# Patient Record
Sex: Female | Born: 1970 | Race: White | Hispanic: No | Marital: Married | Smoking: Former smoker
Health system: Southern US, Community
[De-identification: ages and names within clinical notes are randomized; demographics above are authoritative.]

## PROBLEM LIST (undated history)

## (undated) DIAGNOSIS — R519 Headache, unspecified: Secondary | ICD-10-CM

## (undated) DIAGNOSIS — R011 Cardiac murmur, unspecified: Secondary | ICD-10-CM

## (undated) DIAGNOSIS — O1490 Unspecified pre-eclampsia, unspecified trimester: Secondary | ICD-10-CM

## (undated) DIAGNOSIS — R112 Nausea with vomiting, unspecified: Secondary | ICD-10-CM

## (undated) DIAGNOSIS — I10 Essential (primary) hypertension: Secondary | ICD-10-CM

## (undated) DIAGNOSIS — Z9889 Other specified postprocedural states: Secondary | ICD-10-CM

## (undated) DIAGNOSIS — F329 Major depressive disorder, single episode, unspecified: Secondary | ICD-10-CM

## (undated) DIAGNOSIS — R944 Abnormal results of kidney function studies: Secondary | ICD-10-CM

## (undated) DIAGNOSIS — B019 Varicella without complication: Secondary | ICD-10-CM

## (undated) DIAGNOSIS — K219 Gastro-esophageal reflux disease without esophagitis: Secondary | ICD-10-CM

## (undated) DIAGNOSIS — F32A Depression, unspecified: Secondary | ICD-10-CM

## (undated) DIAGNOSIS — R51 Headache: Secondary | ICD-10-CM

## (undated) HISTORY — DX: Unspecified pre-eclampsia, unspecified trimester: O14.90

## (undated) HISTORY — PX: OTHER SURGICAL HISTORY: SHX169

## (undated) HISTORY — PX: KIDNEY STONE SURGERY: SHX686

## (undated) HISTORY — DX: Headache: R51

## (undated) HISTORY — DX: Depression, unspecified: F32.A

## (undated) HISTORY — DX: Varicella without complication: B01.9

## (undated) HISTORY — DX: Essential (primary) hypertension: I10

## (undated) HISTORY — DX: Headache, unspecified: R51.9

## (undated) HISTORY — DX: Gastro-esophageal reflux disease without esophagitis: K21.9

## (undated) HISTORY — DX: Major depressive disorder, single episode, unspecified: F32.9

## (undated) HISTORY — DX: Cardiac murmur, unspecified: R01.1

---

## 2002-05-14 ENCOUNTER — Other Ambulatory Visit: Admission: RE | Admit: 2002-05-14 | Discharge: 2002-05-14 | Payer: Self-pay | Admitting: Obstetrics and Gynecology

## 2006-10-30 ENCOUNTER — Encounter: Payer: Self-pay | Admitting: Maternal & Fetal Medicine

## 2006-11-20 ENCOUNTER — Ambulatory Visit: Payer: Self-pay | Admitting: Obstetrics and Gynecology

## 2006-11-25 ENCOUNTER — Ambulatory Visit: Payer: Self-pay | Admitting: Obstetrics and Gynecology

## 2006-12-04 ENCOUNTER — Encounter: Payer: Self-pay | Admitting: Maternal & Fetal Medicine

## 2006-12-26 ENCOUNTER — Ambulatory Visit: Payer: Self-pay | Admitting: Obstetrics and Gynecology

## 2006-12-26 HISTORY — PX: UTERINE SEPTUM RESECTION: SHX5386

## 2007-01-08 ENCOUNTER — Encounter: Payer: Self-pay | Admitting: Maternal & Fetal Medicine

## 2008-03-31 ENCOUNTER — Encounter: Payer: Self-pay | Admitting: Maternal & Fetal Medicine

## 2008-04-14 ENCOUNTER — Encounter: Payer: Self-pay | Admitting: Maternal & Fetal Medicine

## 2008-04-24 ENCOUNTER — Encounter: Payer: Self-pay | Admitting: Maternal & Fetal Medicine

## 2008-05-08 ENCOUNTER — Encounter: Payer: Self-pay | Admitting: Obstetrics and Gynecology

## 2010-01-29 ENCOUNTER — Emergency Department: Payer: Self-pay | Admitting: Emergency Medicine

## 2016-01-22 ENCOUNTER — Ambulatory Visit (INDEPENDENT_AMBULATORY_CARE_PROVIDER_SITE_OTHER): Payer: 59 | Admitting: Family Medicine

## 2016-01-22 ENCOUNTER — Encounter: Payer: Self-pay | Admitting: Family Medicine

## 2016-01-22 VITALS — BP 120/82 | HR 89 | Temp 98.5°F | Ht 63.0 in | Wt 119.6 lb

## 2016-01-22 DIAGNOSIS — M533 Sacrococcygeal disorders, not elsewhere classified: Secondary | ICD-10-CM | POA: Diagnosis not present

## 2016-01-22 NOTE — Progress Notes (Addendum)
Patient ID: Misty Fernandez, female   DOB: 12-19-1971, 45 y.o.   MRN: [id number]  Marikay Alar, MD Phone: [phone]  Misty Fernandez is a 45 y.o. female who presents today for new patient visit.  Coccyx pain: Patient notes for the last 1-1.5 months she has had mild discomfort at the tip of her tailbone. Notes it is tender when she sits a certain way. She notes she has been sitting a lot on airplanes recently. The pain started just before that. She notes no direct injury to the area. She notes it is not very painful today. No skin changes. No bruising. She reports normal bowel movements every day. No abnormal vaginal bleeding. No rectal bleeding. No weight loss. No fevers. She has not taken any medicines for this.  Active Ambulatory Problems    Diagnosis Date Noted  . Coccyx pain 01/22/2016   Resolved Ambulatory Problems    Diagnosis Date Noted  . No Resolved Ambulatory Problems   Past Medical History  Diagnosis Date  . Chickenpox   . Headache   . GERD (gastroesophageal reflux disease)   . Heart murmur   . High blood pressure   . Preeclampsia     Family History  Problem Relation Age of Onset  . Arthritis      Parent, Grandparent  . Heart disease      Parent, grandparent  . High blood pressure      Parent  . Stroke      Grandparent  . Diabetes      Parent    Social History   Social History  . Marital Status: Married    Spouse Name: N/A  . Number of Children: N/A  . Years of Education: N/A   Occupational History  . Not on file.   Social History Main Topics  . Smoking status: Former Games developer  . Smokeless tobacco: Not on file  . Alcohol Use: 0.6 oz/week    1 Standard drinks or equivalent per week  . Drug Use: No  . Sexual Activity: Not on file   Other Topics Concern  . Not on file   Social History Narrative  . No narrative on file    ROS   General:  Negative for nexplained weight loss, fever Skin: Negative for new or changing mole, sore that won't  heal HEENT: Negative for trouble hearing, trouble seeing, ringing in ears, mouth sores, hoarseness, change in voice, dysphagia. CV:  Negative for chest pain, dyspnea, edema, palpitations Resp: Negative for cough, dyspnea, hemoptysis GI: Negative for nausea, vomiting, diarrhea, constipation, abdominal pain, melena, hematochezia. GU: Negative for dysuria, incontinence, urinary hesitance, hematuria, vaginal or penile discharge, polyuria, sexual difficulty, lumps in testicle or breasts MSK: Negative for muscle cramps or aches, joint pain or swelling Neuro: Negative for headaches, weakness, numbness, dizziness, passing out/fainting Psych: Negative for depression, anxiety, memory problems  Objective  Physical Exam Filed Vitals:   01/22/16 0829  BP: 120/82  Pulse: 89  Temp: 98.5 F (36.9 C)   Physical Exam  Constitutional: She is well-developed, well-nourished, and in no distress.  HENT:  Head: Normocephalic and atraumatic.  Right Ear: External ear normal.  Left Ear: External ear normal.  Mouth/Throat: Oropharynx is clear and moist. No oropharyngeal exudate.  Eyes: Conjunctivae are normal. Pupils are equal, round, and reactive to light.  Neck: Neck supple.  Cardiovascular: Normal rate, regular rhythm and normal heart sounds.  Exam reveals no gallop and no friction rub.   No murmur heard. Pulmonary/Chest: Effort normal  and breath sounds normal. No respiratory distress. She has no wheezes. She has no rales.  Musculoskeletal:  No midline spine tenderness, no midline spine step-off, no muscular back tenderness, examination of the sacrum and coccyx reveals no skin abnormalities or swelling, there is no erythema of this area, there is minimal tenderness of the tip of the coccyx, there is no apparent skin changes inferior to this  Lymphadenopathy:    She has no cervical adenopathy.  Neurological: She is alert.  5 strength in bilateral quads, hamstrings, plantar flexion, and dorsiflexion,  sensation to light touch intact in bilateral lower extremities.  Skin: Skin is warm and dry. She is not diaphoretic.  Psychiatric: Mood and affect normal.     Assessment/Plan:   Coccyx pain Patient with 1-1.5 months of coccydynia with no apparent cause. No trauma to the area. No signs of infection in the area.  no constipation. No evidence of malignant process on review of systems. Suspect minor repetitive trauma as cause. Advised on getting a doughnut or wedge pillow from the pharmacy to sit on to help take pressure off this. Can use ibuprofen 600 mg every 8 hours as advised. Ice and heat. Continue to monitor. Given return precautions precautions.    Marikay Alar

## 2016-01-22 NOTE — Progress Notes (Signed)
Pre visit review using our clinic review tool, if applicable. No additional management support is needed unless otherwise documented below in the visit note. 

## 2016-01-22 NOTE — Patient Instructions (Signed)
Nice to meet you. You have likely irritated your coccyx. The repeated sitting on airplane seats has likely continued irritated it. You should buy a doughnut pillow or a wedge pillow to take the pressure off the coccyx. You can use ibuprofen 600 mg every 8 hours as needed for discomfort. He can also use heat or ice on the area. If you develop fever, weight loss, constipation, numbness, weakness, loss of bowel or bladder function, numbness between her legs, or any new or change in symptoms please seek medical attention.

## 2016-01-22 NOTE — Assessment & Plan Note (Addendum)
Patient with 1-1.5 months of coccydynia with no apparent cause. No trauma to the area. No signs of infection in the area.  no constipation. No evidence of malignant process on review of systems. Suspect minor repetitive trauma as cause. Advised on getting a doughnut or wedge pillow from the pharmacy to sit on to help take pressure off this. Can use ibuprofen 600 mg every 8 hours as advised. Ice and heat. Continue to monitor. Given return precautions precautions.

## 2016-03-01 ENCOUNTER — Encounter: Payer: Self-pay | Admitting: Family Medicine

## 2016-03-01 ENCOUNTER — Other Ambulatory Visit (HOSPITAL_COMMUNITY)
Admission: RE | Admit: 2016-03-01 | Discharge: 2016-03-01 | Disposition: A | Discharge status: Home / Self Care | Payer: 59 | Source: Ambulatory Visit | Attending: Family Medicine | Admitting: Family Medicine

## 2016-03-01 ENCOUNTER — Ambulatory Visit (INDEPENDENT_AMBULATORY_CARE_PROVIDER_SITE_OTHER): Payer: 59 | Admitting: Family Medicine

## 2016-03-01 ENCOUNTER — Other Ambulatory Visit: Payer: Self-pay | Admitting: *Deleted

## 2016-03-01 VITALS — BP 110/68 | HR 70 | Temp 98.2°F | Ht 63.0 in | Wt 119.8 lb

## 2016-03-01 DIAGNOSIS — Z01419 Encounter for gynecological examination (general) (routine) without abnormal findings: Secondary | ICD-10-CM | POA: Insufficient documentation

## 2016-03-01 DIAGNOSIS — N939 Abnormal uterine and vaginal bleeding, unspecified: Secondary | ICD-10-CM | POA: Insufficient documentation

## 2016-03-01 DIAGNOSIS — Z0001 Encounter for general adult medical examination with abnormal findings: Secondary | ICD-10-CM | POA: Insufficient documentation

## 2016-03-01 DIAGNOSIS — Z1151 Encounter for screening for human papillomavirus (HPV): Secondary | ICD-10-CM | POA: Diagnosis present

## 2016-03-01 DIAGNOSIS — R6889 Other general symptoms and signs: Secondary | ICD-10-CM

## 2016-03-01 DIAGNOSIS — Z124 Encounter for screening for malignant neoplasm of cervix: Secondary | ICD-10-CM

## 2016-03-01 DIAGNOSIS — Z0000 Encounter for general adult medical examination without abnormal findings: Secondary | ICD-10-CM | POA: Insufficient documentation

## 2016-03-01 LAB — COMPREHENSIVE METABOLIC PANEL
ALT: 17 U/L (ref 0–35)
AST: 24 U/L (ref 0–37)
Albumin: 4.8 g/dL (ref 3.5–5.2)
Alkaline Phosphatase: 57 U/L (ref 39–117)
BUN: 19 mg/dL (ref 6–23)
CO2: 29 mEq/L (ref 19–32)
Calcium: 10.2 mg/dL (ref 8.4–10.5)
Chloride: 99 mEq/L (ref 96–112)
Creatinine, Ser: 0.94 mg/dL (ref 0.40–1.20)
GFR: 68.22 mL/min (ref >60.00)
Glucose, Bld: 97 mg/dL (ref 70–99)
Potassium: 3.4 mEq/L — ABNORMAL LOW (ref 3.5–5.1)
Sodium: 145 mEq/L (ref 135–145)
Total Bilirubin: 0.5 mg/dL (ref 0.2–1.2)
Total Protein: 7.5 g/dL (ref 6.0–8.3)

## 2016-03-01 LAB — LIPID PANEL
Cholesterol: 212 mg/dL — ABNORMAL HIGH (ref 0–200)
HDL: 68 mg/dL (ref >39.00)
LDL Cholesterol: 112 mg/dL — ABNORMAL HIGH (ref 0–99)
NonHDL: 144.06
Total CHOL/HDL Ratio: 3
Triglycerides: 162 mg/dL — ABNORMAL HIGH (ref 0.0–149.0)
VLDL: 32.4 mg/dL (ref 0.0–40.0)

## 2016-03-01 LAB — POCT URINE PREGNANCY: Preg Test, Ur: NEGATIVE

## 2016-03-01 LAB — CBC
HCT: 46.7 % — ABNORMAL HIGH (ref 36.0–46.0)
Hemoglobin: 15.9 g/dL — ABNORMAL HIGH (ref 12.0–15.0)
MCHC: 34 g/dL (ref 30.0–36.0)
MCV: 92.7 fl (ref 78.0–100.0)
Platelets: 256 10*3/uL (ref 150.0–400.0)
RBC: 5.04 Mil/uL (ref 3.87–5.11)
RDW: 12.9 % (ref 11.5–15.5)
WBC: 5.4 10*3/uL (ref 4.0–10.5)

## 2016-03-01 LAB — TSH: TSH: 0.59 u[IU]/mL (ref 0.35–4.50)

## 2016-03-01 NOTE — Assessment & Plan Note (Addendum)
Bleeding intermittently prolonged up to 8 days. Vital signs are stable. We'll check CBC. Urine pregnancy test was negative. We'll check TSH as well. Unlikely related to prolactin issue given lack of headaches and vision changes. We'll refer to gynecology for consideration of endometrial biopsy given her age and length of bleeding. Given return precautions.

## 2016-03-01 NOTE — Assessment & Plan Note (Signed)
Overall doing well. Pap smear performed today. Discussed diet and exercise and continuing her current diet and exercise. Up-to-date on vaccinations. Has had an HIV test previously. Will obtain screening lab work as below.

## 2016-03-01 NOTE — Progress Notes (Signed)
Pre visit review using our clinic review tool, if applicable. No additional management support is needed unless otherwise documented below in the visit note. 

## 2016-03-01 NOTE — Progress Notes (Signed)
Patient ID: Misty Fernandez, female   DOB: [birth year], 45 y.o.   MRN: [id number]  [name], MD Phone: [phone]  Misty Fernandez is a 45 y.o. female who presents today for a complete physical exam.  Patient reports overall she is doing well. Reports her diet is average. She is a vegetarian. Gets her protein from tofu, beans, and nuts. Drinks diet sodas. She exercises by doing yoga, Pilates, [place], and running 3-4 days per week. Tdap 7 years ago. Flu shot in November. Unsure of her last Pap smear. Notes her periods have become irregular recently. They can last anywhere from 2-8 days. They occur on a regular frequency every month. Notes they have been irregular for the last year. No intermenstrual bleeding. First menstrual period was at 45 years of age. She is a G3 P1 111. She does note that she becomes more easily stressed out and irritated since this has started to occur. Had HIV screening done 12-15 years ago.  Does not smoke. Occasional alcohol use. No illicit drug use.  Active Ambulatory Problems    Diagnosis Date Noted  . Coccyx pain 01/22/2016  . Encounter for general adult medical examination with abnormal findings 03/01/2016  . Abnormal uterine bleeding 03/01/2016   Resolved Ambulatory Problems    Diagnosis Date Noted  . No Resolved Ambulatory Problems   Past Medical History  Diagnosis Date  . Chickenpox   . Headache   . GERD (gastroesophageal reflux disease)   . Heart murmur   . High blood pressure   . Preeclampsia     Family History  Problem Relation Age of Onset  . Arthritis      Parent, Grandparent  . Heart disease      Parent, grandparent  . High blood pressure      Parent  . Stroke      Grandparent  . Diabetes      Parent    Social History   Social History  . Marital Status: Married    Spouse Name: N/A  . Number of Children: N/A  . Years of Education: N/A   Occupational History  . Not on file.   Social History Main Topics  .  Smoking status: Former Research scientist (life sciences)  . Smokeless tobacco: Not on file  . Alcohol Use: 0.6 oz/week    1 Standard drinks or equivalent per week  . Drug Use: No  . Sexual Activity: Not on file   Other Topics Concern  . Not on file   Social History Narrative    ROS   General:  Negative for nexplained weight loss, fever Skin: Negative for new or changing mole, sore that won't heal HEENT: Negative for trouble hearing, trouble seeing, ringing in ears, mouth sores, hoarseness, change in voice, dysphagia. CV:  Negative for chest pain, dyspnea, edema, palpitations Resp: Negative for cough, dyspnea, hemoptysis GI: Negative for nausea, vomiting, diarrhea, constipation, abdominal pain, melena, hematochezia. GU: Negative for dysuria, incontinence, urinary hesitance, hematuria, vaginal or penile discharge, polyuria, sexual difficulty, lumps in testicle or breasts MSK: Negative for muscle cramps or aches, joint pain or swelling Neuro: Negative for headaches, weakness, numbness, dizziness, passing out/fainting Psych: Positive for stress. Negative for depression, anxiety, memory problems  Objective  Physical Exam Filed Vitals:   03/01/16 0824  BP: 110/68  Pulse: 70  Temp: 98.2 F (36.8 C)    BP Readings from Last 3 Encounters:  03/01/16 110/68  01/22/16 120/82   Wt Readings from Last 3 Encounters:  03/01/16 119 lb  12.8 oz (54.341 kg)  01/22/16 119 lb 9.6 oz (54.25 kg)    Physical Exam  Constitutional: She is well-developed, well-nourished, and in no distress.  HENT:  Head: Normocephalic and atraumatic.  Right Ear: External ear normal.  Left Ear: External ear normal.  Mouth/Throat: Oropharynx is clear and moist. No oropharyngeal exudate.  Eyes: Conjunctivae are normal. Pupils are equal, round, and reactive to light.  Neck: Neck supple.  Cardiovascular: Normal rate, regular rhythm and normal heart sounds.  Exam reveals no gallop and no friction rub.   No murmur heard. Pulmonary/Chest:  Effort normal and breath sounds normal. No respiratory distress. She has no wheezes. She has no rales.  Abdominal: Soft. Bowel sounds are normal. She exhibits no distension. There is no tenderness. There is no rebound and no guarding.  Genitourinary:  Normal labia, normal vaginal mucosa with minimal bleeding from the cervical os, no abnormalities to the cervix, no abnormalities on bimanual exam  Musculoskeletal: She exhibits no edema.  Lymphadenopathy:    She has no cervical adenopathy.  Neurological: She is alert. Gait normal.  Skin: Skin is warm and dry. She is not diaphoretic.     Assessment/Plan:   Abnormal uterine bleeding Bleeding intermittently prolonged up to 8 days. Vital signs are stable. We'll check CBC. Urine pregnancy test was negative. We'll check TSH as well. Unlikely related to prolactin issue given lack of headaches and vision changes. We'll refer to gynecology for consideration of endometrial biopsy given her age and length of bleeding. Given return precautions.  Encounter for general adult medical examination with abnormal findings Overall doing well. Pap smear performed today. Discussed diet and exercise and continuing her current diet and exercise. Up-to-date on vaccinations. Has had an HIV test previously. Will obtain screening lab work as below.    Orders Placed This Encounter  Procedures  . TSH  . CBC  . Comp Met (CMET)  . Lipid Profile  . Ambulatory referral to Gynecology    Referral Priority:  Routine    Referral Type:  Consultation    Referral Reason:  Specialty Services Required    Requested Specialty:  Gynecology    Number of Visits Requested:  1  . POCT urine pregnancy    No orders of the defined types were placed in this encounter.     [name], MD [place]

## 2016-03-01 NOTE — Patient Instructions (Signed)
Nice to see you. We are going to refer you to a gynecologist for your prolonged periods.  We will obtain lab work as well.  If you develop persistent vaginal bleeding, increased vaginal bleeding, abdominal pain, or any new or change in symptoms please seek medical attention.

## 2016-03-02 LAB — CYTOLOGY - PAP

## 2016-03-04 ENCOUNTER — Other Ambulatory Visit: Payer: Self-pay | Admitting: Family Medicine

## 2016-03-04 DIAGNOSIS — D582 Other hemoglobinopathies: Secondary | ICD-10-CM

## 2016-03-04 DIAGNOSIS — E876 Hypokalemia: Secondary | ICD-10-CM

## 2016-03-11 ENCOUNTER — Other Ambulatory Visit (INDEPENDENT_AMBULATORY_CARE_PROVIDER_SITE_OTHER): Payer: 59

## 2016-03-11 DIAGNOSIS — D582 Other hemoglobinopathies: Secondary | ICD-10-CM

## 2016-03-11 DIAGNOSIS — E876 Hypokalemia: Secondary | ICD-10-CM

## 2016-03-11 LAB — BASIC METABOLIC PANEL
BUN: 21 mg/dL (ref 6–23)
CO2: 32 mEq/L (ref 19–32)
Calcium: 9.9 mg/dL (ref 8.4–10.5)
Chloride: 102 mEq/L (ref 96–112)
Creatinine, Ser: 0.95 mg/dL (ref 0.40–1.20)
GFR: 67.38 mL/min (ref >60.00)
Glucose, Bld: 93 mg/dL (ref 70–99)
Potassium: 3.9 mEq/L (ref 3.5–5.1)
Sodium: 141 mEq/L (ref 135–145)

## 2016-03-11 LAB — CBC
HCT: 45.1 % (ref 36.0–46.0)
Hemoglobin: 15.7 g/dL — ABNORMAL HIGH (ref 12.0–15.0)
MCHC: 34.9 g/dL (ref 30.0–36.0)
MCV: 90.2 fl (ref 78.0–100.0)
Platelets: 229 10*3/uL (ref 150.0–400.0)
RBC: 4.99 Mil/uL (ref 3.87–5.11)
RDW: 12.5 % (ref 11.5–15.5)
WBC: 6.4 10*3/uL (ref 4.0–10.5)

## 2016-03-14 ENCOUNTER — Telehealth: Payer: Self-pay | Admitting: Family Medicine

## 2016-03-14 ENCOUNTER — Other Ambulatory Visit: Payer: Self-pay | Admitting: Family Medicine

## 2016-03-14 DIAGNOSIS — D582 Other hemoglobinopathies: Secondary | ICD-10-CM

## 2016-03-14 NOTE — Telephone Encounter (Signed)
Notified patient of lab results 

## 2016-03-14 NOTE — Telephone Encounter (Signed)
Pt called to get lab results. Call pt @ [phone]. Thank you!

## 2016-04-14 ENCOUNTER — Other Ambulatory Visit: Payer: 59

## 2017-04-28 ENCOUNTER — Encounter: Payer: Self-pay | Admitting: Family Medicine

## 2017-04-28 ENCOUNTER — Ambulatory Visit (INDEPENDENT_AMBULATORY_CARE_PROVIDER_SITE_OTHER): Payer: 59 | Admitting: Family Medicine

## 2017-04-28 VITALS — BP 130/88 | HR 86 | Temp 98.2°F | Ht 63.0 in | Wt 119.8 lb

## 2017-04-28 DIAGNOSIS — Z1329 Encounter for screening for other suspected endocrine disorder: Secondary | ICD-10-CM | POA: Diagnosis not present

## 2017-04-28 DIAGNOSIS — Z1231 Encounter for screening mammogram for malignant neoplasm of breast: Secondary | ICD-10-CM

## 2017-04-28 DIAGNOSIS — Z130 Encounter for screening for diseases of the blood and blood-forming organs and certain disorders involving the immune mechanism: Secondary | ICD-10-CM

## 2017-04-28 DIAGNOSIS — Z0001 Encounter for general adult medical examination with abnormal findings: Secondary | ICD-10-CM

## 2017-04-28 DIAGNOSIS — F39 Unspecified mood [affective] disorder: Secondary | ICD-10-CM | POA: Insufficient documentation

## 2017-04-28 DIAGNOSIS — Z13220 Encounter for screening for lipoid disorders: Secondary | ICD-10-CM

## 2017-04-28 DIAGNOSIS — F321 Major depressive disorder, single episode, moderate: Secondary | ICD-10-CM

## 2017-04-28 DIAGNOSIS — Z1239 Encounter for other screening for malignant neoplasm of breast: Secondary | ICD-10-CM

## 2017-04-28 MED ORDER — BUPROPION HCL ER (XL) 150 MG PO TB24: 150.0000 mg | ORAL_TABLET | Freq: Every day | ORAL | 1 refills | Status: DC

## 2017-04-28 NOTE — Progress Notes (Signed)
Pre visit review using our clinic review tool, if applicable. No additional management support is needed unless otherwise documented below in the visit note. 

## 2017-04-28 NOTE — Assessment & Plan Note (Signed)
Issues with depression for a long time. No SI. Discussed treatment options. Patient opted for Wellbutrin and referral to psychology. She'll follow-up in 8 weeks. Given return precautions.

## 2017-04-28 NOTE — Assessment & Plan Note (Signed)
Physical exam completed. Breast exam done. Pap smear deferred given negative Pap smear last year. Discussed menstrual cycles. Offered gynecology referral given history of irregular periods though patient deferred. She'll let us know if she continues to have any issues. Mammogram ordered. Plan on rechecking blood pressure at follow-up in 8 weeks. She'll return for fasting lab work.

## 2017-04-28 NOTE — Patient Instructions (Addendum)
Nice to see you. Please continue to work on diet and exercise. Please watch your periods and if they become prolonged or you miss periods please let us know. We'll get a mammogram done. We'll start you on Wellbutrin and have you see a psychologist. If you develop thoughts of harming herself please seek medical attention medially.

## 2017-04-28 NOTE — Assessment & Plan Note (Signed)
>>  ASSESSMENT AND PLAN FOR DEPRESSION, MAJOR, SINGLE EPISODE, MODERATE (HCC) WRITTEN ON 04/28/2017  9:04 AM BY SONNENBERG, ERIC G, MD  Issues with depression for a long time. No SI. Discussed treatment options. Patient opted for Wellbutrin  and referral to psychology. She'll follow-up in 8 weeks. Given return precautions.

## 2017-04-28 NOTE — Progress Notes (Signed)
Misty Rumps, MD Phone: [phone]  Misty Fernandez is a 46 y.o. female who presents today for physical exam.   Exercises 3 times a week by going to exercise class. Diet: She is a vegetarian. Lots of vegetables. Also protein bars. Lots of bagels. Trying to do better with this. Tetanus vaccination up-to-date. Pap smear negative last year. Has menstrual cycles monthly typically. Lasts anywhere from 2-5 days. She had irregular bleeding last year and was referred to gynecology though she did not see them. She wants to proceed with a mammogram. Former smoker. Occasional alcohol use. No illicit drug use. Sees ophthalmologist once yearly.  Depression: This has been an ongoing issue for multiple years. Has seen therapists on several occasions. She has decreased interest in things. Does at times wish she could cease to exist though has no suicidal thoughts or intent or plan to harm herself. Lots of stress at work. Feels this overwhelming weight.  Active Ambulatory Problems    Diagnosis Date Noted  . Coccyx pain 01/22/2016  . Encounter for general adult medical examination with abnormal findings 03/01/2016  . Abnormal uterine bleeding 03/01/2016  . Depression, major, single episode, moderate ([place]) 04/28/2017   Resolved Ambulatory Problems    Diagnosis Date Noted  . No Resolved Ambulatory Problems   Past Medical History:  Diagnosis Date  . Chickenpox   . GERD (gastroesophageal reflux disease)   . Headache   . Heart murmur   . High blood pressure   . Preeclampsia     Family History  Problem Relation Age of Onset  . Arthritis      Parent, Grandparent  . Heart disease      Parent, grandparent  . High blood pressure      Parent  . Stroke      Grandparent  . Diabetes      Parent    Social History   Social History  . Marital status: Married    Spouse name: N/A  . Number of children: N/A  . Years of education: N/A   Occupational History  . Not on file.   Social  History Main Topics  . Smoking status: Former Research scientist (life sciences)  . Smokeless tobacco: Never Used  . Alcohol use 0.6 oz/week    1 Standard drinks or equivalent per week  . Drug use: No  . Sexual activity: Not on file   Other Topics Concern  . Not on file   Social History Narrative  . No narrative on file    ROS  General:  Negative for nexplained weight loss, fever Skin: Negative for new or changing mole, sore that won't heal HEENT: Negative for trouble hearing, trouble seeing, ringing in ears, mouth sores, hoarseness, change in voice, dysphagia. CV:  Negative for chest pain, dyspnea, edema, palpitations Resp: Negative for cough, dyspnea, hemoptysis GI: Negative for nausea, vomiting, diarrhea, constipation, abdominal pain, melena, hematochezia. GU: Negative for dysuria, incontinence, urinary hesitance, hematuria, vaginal or penile discharge, polyuria, sexual difficulty, lumps in testicle or breasts MSK: Negative for muscle cramps or aches, joint pain or swelling Neuro: Negative for headaches, weakness, numbness, dizziness, passing out/fainting Psych: Positive for depression, negative for anxiety, memory problems  Objective  Physical Exam Vitals:   04/28/17 0805 04/28/17 0829  BP: 140/82 130/88  Pulse: 86   Temp: 98.2 F (36.8 C)     BP Readings from Last 3 Encounters:  04/28/17 130/88  03/01/16 110/68  01/22/16 120/82   Wt Readings from Last 3 Encounters:  04/28/17 119 lb  12.8 oz (54.3 kg)  03/01/16 119 lb 12.8 oz (54.3 kg)  01/22/16 119 lb 9.6 oz (54.3 kg)    Physical Exam  Constitutional: She is well-developed, well-nourished, and in no distress.  HENT:  Head: Normocephalic and atraumatic.  Mouth/Throat: Oropharynx is clear and moist. No oropharyngeal exudate.  Eyes: Conjunctivae are normal. Pupils are equal, round, and reactive to light.  Cardiovascular: Normal rate, regular rhythm and normal heart sounds.   Pulmonary/Chest: Effort normal and breath sounds normal.    Abdominal: Soft. Bowel sounds are normal. She exhibits no distension. There is no tenderness. There is no rebound and no guarding.  Genitourinary:  Genitourinary Comments: Bilateral breasts with no masses, skin changes, or nipple changes, no axillary masses bilaterally  Musculoskeletal: She exhibits no edema.  Neurological: She is alert. Gait normal.  Skin: Skin is warm and dry.  Psychiatric:  Mood depressed, affect normal     Assessment/Plan:   Encounter for general adult medical examination with abnormal findings Physical exam completed. Breast exam done. Pap smear deferred given negative Pap smear last year. Discussed menstrual cycles. Offered gynecology referral given history of irregular periods though patient deferred. She'll let us know if she continues to have any issues. Mammogram ordered. Plan on rechecking blood pressure at follow-up in 8 weeks. She'll return for fasting lab work.  Depression, major, single episode, moderate (HCC) Issues with depression for a long time. No SI. Discussed treatment options. Patient opted for Wellbutrin and referral to psychology. She'll follow-up in 8 weeks. Given return precautions.   Orders Placed This Encounter  Procedures  . MM Digital Screening    Standing Status:   Future    Standing Expiration Date:   06/28/2018    Order Specific Question:   Reason for Exam (SYMPTOM  OR DIAGNOSIS REQUIRED)    Answer:   breast cancer screening    Order Specific Question:   Is the patient pregnant?    Answer:   No    Order Specific Question:   Preferred imaging location?    Answer:   [facility]  . CBC    Standing Status:   Future    Standing Expiration Date:   04/28/2018  . Comp Met (CMET)    Standing Status:   Future    Standing Expiration Date:   04/28/2018  . TSH    Standing Status:   Future    Standing Expiration Date:   04/28/2018  . Lipid panel    Standing Status:   Future    Standing Expiration Date:   04/28/2018  . Ambulatory referral  to Psychology    Referral Priority:   Routine    Referral Type:   Psychiatric    Referral Reason:   Specialty Services Required    Requested Specialty:   Psychology    Number of Visits Requested:   1    Meds ordered this encounter  Medications  . buPROPion (WELLBUTRIN XL) 150 MG 24 hr tablet    Sig: Take 1 tablet (150 mg total) by mouth daily.    Dispense:  90 tablet    Refill:  1     Misty Rumps, MD [place]

## 2017-05-19 ENCOUNTER — Telehealth: Payer: Self-pay

## 2017-05-19 ENCOUNTER — Other Ambulatory Visit (INDEPENDENT_AMBULATORY_CARE_PROVIDER_SITE_OTHER): Payer: 59

## 2017-05-19 DIAGNOSIS — Z130 Encounter for screening for diseases of the blood and blood-forming organs and certain disorders involving the immune mechanism: Secondary | ICD-10-CM | POA: Diagnosis not present

## 2017-05-19 DIAGNOSIS — Z1329 Encounter for screening for other suspected endocrine disorder: Secondary | ICD-10-CM

## 2017-05-19 DIAGNOSIS — Z13220 Encounter for screening for lipoid disorders: Secondary | ICD-10-CM | POA: Diagnosis not present

## 2017-05-19 DIAGNOSIS — D582 Other hemoglobinopathies: Secondary | ICD-10-CM

## 2017-05-19 LAB — COMPREHENSIVE METABOLIC PANEL
ALT: 12 U/L (ref 0–35)
AST: 22 U/L (ref 0–37)
Albumin: 5.1 g/dL (ref 3.5–5.2)
Alkaline Phosphatase: 59 U/L (ref 39–117)
BUN: 22 mg/dL (ref 6–23)
CO2: 35 mEq/L — ABNORMAL HIGH (ref 19–32)
Calcium: 10.5 mg/dL (ref 8.4–10.5)
Chloride: 97 mEq/L (ref 96–112)
Creatinine, Ser: 1.07 mg/dL (ref 0.40–1.20)
GFR: 58.44 mL/min — ABNORMAL LOW (ref >60.00)
Glucose, Bld: 109 mg/dL — ABNORMAL HIGH (ref 70–99)
Potassium: 3.6 mEq/L (ref 3.5–5.1)
Sodium: 140 mEq/L (ref 135–145)
Total Bilirubin: 0.6 mg/dL (ref 0.2–1.2)
Total Protein: 7.8 g/dL (ref 6.0–8.3)

## 2017-05-19 LAB — LIPID PANEL
Cholesterol: 208 mg/dL — ABNORMAL HIGH (ref 0–200)
HDL: 66.4 mg/dL (ref >39.00)
LDL Cholesterol: 120 mg/dL — ABNORMAL HIGH (ref 0–99)
NonHDL: 141.84
Total CHOL/HDL Ratio: 3
Triglycerides: 107 mg/dL (ref 0.0–149.0)
VLDL: 21.4 mg/dL (ref 0.0–40.0)

## 2017-05-19 LAB — CBC
HCT: 49.8 % — ABNORMAL HIGH (ref 36.0–46.0)
Hemoglobin: 17 g/dL — ABNORMAL HIGH (ref 12.0–15.0)
MCHC: 34.1 g/dL (ref 30.0–36.0)
MCV: 92.7 fl (ref 78.0–100.0)
Platelets: 277 10*3/uL (ref 150.0–400.0)
RBC: 5.38 Mil/uL — ABNORMAL HIGH (ref 3.87–5.11)
RDW: 13 % (ref 11.5–15.5)
WBC: 4.3 10*3/uL (ref 4.0–10.5)

## 2017-05-19 LAB — TSH: TSH: 0.94 u[IU]/mL (ref 0.35–4.50)

## 2017-05-19 NOTE — Telephone Encounter (Signed)
-----   Message from Glori LuisEric G Sonnenberg, MD sent at 05/19/2017  1:13 PM EDT ----- Please let the patient know that her hemoglobin is elevated slightly higher than it was previously. Given persistent elevation she would benefit from seeing hematology. I can place a referral if she is willing. Her cholesterol is mildly elevated and she should continue to work on diet and exercise. Her kidney function is slightly worse than previously and I would like to recheck this in several weeks. Thanks.

## 2017-05-19 NOTE — Telephone Encounter (Signed)
Left message to return call 

## 2017-05-23 NOTE — Telephone Encounter (Signed)
Left message to return call 

## 2017-05-29 NOTE — Telephone Encounter (Signed)
Patient notified and would like referral to hematology. Patient will have recheck kidney labs done at her follow up appmt in june

## 2017-05-29 NOTE — Addendum Note (Signed)
Addended by: Glori LuisSONNENBERG, Litsy Epting G on: 05/29/2017 08:18 PM   Modules accepted: Orders

## 2017-05-29 NOTE — Telephone Encounter (Signed)
Referral placed.

## 2017-05-29 NOTE — Telephone Encounter (Signed)
Pt called back to get results. Please advise, thank you!  Call pt @ [phone]

## 2017-06-20 ENCOUNTER — Ambulatory Visit (INDEPENDENT_AMBULATORY_CARE_PROVIDER_SITE_OTHER): Payer: 59 | Admitting: Family Medicine

## 2017-06-20 ENCOUNTER — Encounter: Payer: Self-pay | Admitting: Family Medicine

## 2017-06-20 ENCOUNTER — Encounter: Payer: Self-pay | Admitting: Oncology

## 2017-06-20 ENCOUNTER — Inpatient Hospital Stay: Payer: 59 | Attending: Oncology | Admitting: Oncology

## 2017-06-20 ENCOUNTER — Inpatient Hospital Stay: Payer: 59

## 2017-06-20 VITALS — BP 136/84 | HR 69 | Temp 98.1°F | Resp 18 | Ht 63.0 in | Wt 114.3 lb

## 2017-06-20 DIAGNOSIS — R011 Cardiac murmur, unspecified: Secondary | ICD-10-CM | POA: Insufficient documentation

## 2017-06-20 DIAGNOSIS — Z87891 Personal history of nicotine dependence: Secondary | ICD-10-CM | POA: Insufficient documentation

## 2017-06-20 DIAGNOSIS — N939 Abnormal uterine and vaginal bleeding, unspecified: Secondary | ICD-10-CM | POA: Diagnosis not present

## 2017-06-20 DIAGNOSIS — I10 Essential (primary) hypertension: Secondary | ICD-10-CM | POA: Insufficient documentation

## 2017-06-20 DIAGNOSIS — K219 Gastro-esophageal reflux disease without esophagitis: Secondary | ICD-10-CM | POA: Insufficient documentation

## 2017-06-20 DIAGNOSIS — R7989 Other specified abnormal findings of blood chemistry: Secondary | ICD-10-CM | POA: Diagnosis not present

## 2017-06-20 DIAGNOSIS — F321 Major depressive disorder, single episode, moderate: Secondary | ICD-10-CM | POA: Diagnosis not present

## 2017-06-20 DIAGNOSIS — F329 Major depressive disorder, single episode, unspecified: Secondary | ICD-10-CM | POA: Diagnosis not present

## 2017-06-20 DIAGNOSIS — D751 Secondary polycythemia: Secondary | ICD-10-CM

## 2017-06-20 DIAGNOSIS — D582 Other hemoglobinopathies: Secondary | ICD-10-CM | POA: Insufficient documentation

## 2017-06-20 LAB — CBC WITH DIFFERENTIAL/PLATELET
Basophils Absolute: 0 10*3/uL (ref 0–0.1)
Basophils Relative: 1 %
Eosinophils Absolute: 0.1 10*3/uL (ref 0–0.7)
Eosinophils Relative: 2 %
HCT: 45.6 % (ref 35.0–47.0)
Hemoglobin: 16.3 g/dL — ABNORMAL HIGH (ref 12.0–16.0)
Lymphocytes Relative: 27 %
Lymphs Abs: 0.9 10*3/uL — ABNORMAL LOW (ref 1.0–3.6)
MCH: 32.2 pg (ref 26.0–34.0)
MCHC: 35.7 g/dL (ref 32.0–36.0)
MCV: 90.4 fL (ref 80.0–100.0)
Monocytes Absolute: 0.3 10*3/uL (ref 0.2–0.9)
Monocytes Relative: 10 %
Neutro Abs: 2 10*3/uL (ref 1.4–6.5)
Neutrophils Relative %: 60 %
Platelets: 244 10*3/uL (ref 150–440)
RBC: 5.05 MIL/uL (ref 3.80–5.20)
RDW: 12.9 % (ref 11.5–14.5)
WBC: 3.4 10*3/uL — ABNORMAL LOW (ref 3.6–11.0)

## 2017-06-20 LAB — URINALYSIS, COMPLETE (UACMP) WITH MICROSCOPIC
Bacteria, UA: NONE SEEN
Bilirubin Urine: NEGATIVE
Glucose, UA: NEGATIVE mg/dL
Hgb urine dipstick: NEGATIVE
Ketones, ur: NEGATIVE mg/dL
Leukocytes, UA: NEGATIVE
Nitrite: NEGATIVE
Protein, ur: NEGATIVE mg/dL
RBC / HPF: NONE SEEN RBC/hpf (ref 0–5)
Specific Gravity, Urine: 1.018 (ref 1.005–1.030)
pH: 9 — ABNORMAL HIGH (ref 5.0–8.0)

## 2017-06-20 MED ORDER — BUPROPION HCL ER (XL) 300 MG PO TB24: 300.0000 mg | ORAL_TABLET | Freq: Every day | ORAL | 2 refills | Status: DC

## 2017-06-20 NOTE — Assessment & Plan Note (Signed)
Has been elevated over the last year or so. She has an appointment with hematology today.

## 2017-06-20 NOTE — Progress Notes (Signed)
Patient here today as new evaluation regarding elevated Hgb.  Referred by Dr. Birdie SonsSonnenberg.  Patient offers no concerns.

## 2017-06-20 NOTE — Assessment & Plan Note (Signed)
Improved on Wellbutrin. She's interested in increasing the dose. We'll start her on 300 mg of Wellbutrin XL. She'll follow-up in 3 months.

## 2017-06-20 NOTE — Progress Notes (Signed)
  Misty AlarEric Ishi Danser, MD Phone: [phone]  Misty Fernandez is a 46 y.o. female who presents today for follow-up.  Depression: Patient notes this is a fair bit better. She's noticed quite a difference over the last couple of weeks on the Wellbutrin. Noticed that it was slow going initially. She notes no anxiety. She is been unable to see the psychologist. She notes no SI. No thoughts of being better off ceasing to exist.  [facility] 06/[facility]/2017  Decreased Interest 0  Down, Depressed, Hopeless 1  PHQ - 2 Score 1  Altered sleeping 0  Tired, decreased energy 0  Change in appetite 0  Feeling bad or failure about yourself  1  Trouble concentrating 0  Moving slowly or fidgety/restless 0  Suicidal thoughts 0  PHQ-9 Score 2    She has an appointment right after this to see hematology. She wanted to clarify what this was for. Her hemoglobin has been elevated.  ROS see history of present illness  Objective  Physical Exam Vitals:   06/20/17 0802  BP: 118/88  Pulse: 69  Temp: 98.1 F (36.7 C)    BP Readings from Last 3 Encounters:  06/20/17 118/88  04/28/17 130/88  03/01/16 110/68   Wt Readings from Last 3 Encounters:  06/20/17 115 lb 3.2 oz (52.3 kg)  04/28/17 119 lb 12.8 oz (54.3 kg)  03/01/16 119 lb 12.8 oz (54.3 kg)    Physical Exam  Constitutional: No distress.  Cardiovascular: Normal rate, regular rhythm and normal heart sounds.   Pulmonary/Chest: Effort normal and breath sounds normal.  Skin: She is not diaphoretic.  Psychiatric: Affect normal.  Mood improved     Assessment/Plan: Please see individual problem list.  Depression, major, single episode, moderate (HCC) Improved on Wellbutrin. She's interested in increasing the dose. We'll start her on 300 mg of Wellbutrin XL. She'll follow-up in 3 months.  Elevated hemoglobin (HCC) Has been elevated over the last year or so. She has an appointment with hematology today.   No orders of the defined  types were placed in this encounter.   Meds ordered this encounter  Medications  . buPROPion (WELLBUTRIN XL) 300 MG 24 hr tablet    Sig: Take 1 tablet (300 mg total) by mouth daily.    Dispense:  90 tablet    Refill:  2   Misty AlarEric Moua Rasmusson, MD [facility] [place]

## 2017-06-20 NOTE — Assessment & Plan Note (Signed)
>>  ASSESSMENT AND PLAN FOR DEPRESSION, MAJOR, SINGLE EPISODE, MODERATE (HCC) WRITTEN ON 06/20/2017  8:14 AM BY SONNENBERG, ERIC G, MD  Improved on Wellbutrin . She's interested in increasing the dose. We'll start her on 300 mg of Wellbutrin  XL. She'll follow-up in 3 months.

## 2017-06-20 NOTE — Progress Notes (Signed)
Hematology/Oncology Consult note [facility] [phone] [phone]  Patient Care Team: Leone Haven, MD as PCP - General (Family Medicine)   Name of the patient: Misty Fernandez  [id number]  12-12-1971    Reason for referral- elevated hemoglobin   Referring physician- Dr. Caryl Bis  Date of visit: 06/20/17   History of presenting illness- patient is a 46 year old female with a past medical history significant for depression for which she is on Wellbutrin. She has been referred to [place] for elevated hemoglobin. Most recent CBC from 05/19/2017 showed white count of 4.3, H&H of 17/49.8 and platelet count of 277. In March 2017 her hemoglobin was 15.9/46.7. She had a normal hemoglobin until 2009 when her hemoglobin was around 12  Patient feels well and denies any complaints. She smoked in the remote past but has not smoked over the last 20 years. She reports getting a good night's sleep and does not wake up multiple times at night. Denies any fatigue, unintentional weight loss or drenching night sweats.no family h/o blood disorders  ECOG PS- 0  Pain scale- 0   Review of systems- Review of Systems  Constitutional: Negative for chills, fever, malaise/fatigue and weight loss.  HENT: Negative for congestion, ear discharge and nosebleeds.   Eyes: Negative for blurred vision.  Respiratory: Negative for cough, hemoptysis, sputum production, shortness of breath and wheezing.   Cardiovascular: Negative for chest pain, palpitations, orthopnea and claudication.  Gastrointestinal: Negative for abdominal pain, blood in stool, constipation, diarrhea, heartburn, melena, nausea and vomiting.  Genitourinary: Negative for dysuria, flank pain, frequency, hematuria and urgency.  Musculoskeletal: Negative for back pain, joint pain and myalgias.  Skin: Negative for rash.  Neurological: Negative for dizziness, tingling, focal weakness, seizures, weakness and  headaches.  Endo/Heme/Allergies: Does not bruise/bleed easily.  Psychiatric/Behavioral: Negative for depression and suicidal ideas. The patient does not have insomnia.     No Known Allergies  Patient Active Problem List   Diagnosis Date Noted  . Elevated hemoglobin ([place]) 06/20/2017  . Depression, major, single episode, moderate ([place]) 04/28/2017  . Abnormal uterine bleeding 03/01/2016     Past Medical History:  Diagnosis Date  . Chickenpox   . GERD (gastroesophageal reflux disease)   . Headache   . Heart murmur   . High blood pressure   . Preeclampsia      Past Surgical History:  Procedure Laterality Date  . No surgical history      Social History   Social History  . Marital status: Married    Spouse name: N/A  . Number of children: N/A  . Years of education: N/A   Occupational History  . Not on file.   Social History Main Topics  . Smoking status: Former Research scientist (life sciences)  . Smokeless tobacco: Never Used  . Alcohol use 0.6 oz/week    1 Standard drinks or equivalent per week  . Drug use: No  . Sexual activity: Not on file   Other Topics Concern  . Not on file   Social History Narrative  . No narrative on file     Family History  Problem Relation Age of Onset  . Arthritis Unknown        Parent, Grandparent  . Heart disease Unknown        Parent, grandparent  . High blood pressure Unknown        Parent  . Stroke Unknown        Grandparent  . Diabetes Unknown  Parent     Current Outpatient Prescriptions:  .  buPROPion (WELLBUTRIN XL) 300 MG 24 hr tablet, Take 1 tablet (300 mg total) by mouth daily., Disp: 90 tablet, Rfl: 2   Physical exam:  Vitals:   06/20/17 0902  BP: 136/84  Pulse: 69  Resp: 18  Temp: 98.1 F (36.7 C)  TempSrc: Tympanic  Weight: 114 lb 5 oz (51.9 kg)  Height: 5' 3"  (1.6 m)  saturating at 99% on RA   Physical Exam  Constitutional: She is oriented to person, place, and time.  She is thin and cheerful.appears in no  acute distress  HENT:  Head: Normocephalic and atraumatic.  Eyes: EOM are normal. Pupils are equal, round, and reactive to light.  Neck: Normal range of motion.  Cardiovascular: Normal rate, regular rhythm and normal heart sounds.   Pulmonary/Chest: Effort normal and breath sounds normal.  Abdominal: Soft. Bowel sounds are normal.  No palpable splenomegaly  Neurological: She is alert and oriented to person, place, and time.  Skin: Skin is warm and dry.       CMP Latest Ref Rng & Units 05/19/2017  Glucose 70 - 99 mg/dL 109(H)  BUN 6 - 23 mg/dL 22  Creatinine 0.40 - 1.20 mg/dL 1.07  Sodium 135 - 145 mEq/L 140  Potassium 3.5 - 5.1 mEq/L 3.6  Chloride 96 - 112 mEq/L 97  CO2 19 - 32 mEq/L 35(H)  Calcium 8.4 - 10.5 mg/dL 10.5  Total Protein 6.0 - 8.3 g/dL 7.8  Total Bilirubin 0.2 - 1.2 mg/dL 0.6  Alkaline Phos 39 - 117 U/L 59  AST 0 - 37 U/L 22  ALT 0 - 35 U/L 12   CBC Latest Ref Rng & Units 05/19/2017  WBC 4.0 - 10.5 K/uL 4.3  Hemoglobin 12.0 - 15.0 g/dL 17.0(H)  Hematocrit 36.0 - 46.0 % 49.8(H)  Platelets 150.0 - 400.0 K/uL 277.0     Assessment and plan- Patient is a 46 y.o. female referred for elevated hemoglobin  Discussed with patient what is primary versus secondary polycythemia. Based on patient history cause of polycythemia is unclear. Will check labs as mentioned below. If cause remains unclear, I will consider doing bone marrow biopsy to rule out primary polycythemia vera  Today will check cbc with diff, EPO level, jak2 mutation testing with reflex to exon 12, CXR and urinalysis. I will see her back in 2 weeks time to discuss results and further management   Thank you for this kind referral and the opportunity to participate in the care of this patient   Visit Diagnosis 1. Polycythemia     Dr. Randa Evens, MD, MPH [facility] at [facility] Pager- [phone] 06/20/2017  9:25 AM

## 2017-06-20 NOTE — Patient Instructions (Signed)
Nice to see you. We are going to increase your dose to 300 mg. You can take two 150 mg tablets until you you have run out. If you develop thoughts of harming your self please seek medical attention..Marland Kitchen

## 2017-06-21 LAB — ERYTHROPOIETIN: Erythropoietin: 8.2 m[IU]/mL (ref 2.6–18.5)

## 2017-06-29 LAB — JAK2 EXONS 12-15

## 2017-06-29 LAB — JAK2  V617F QUAL. WITH REFLEX TO EXON 12: Reflex:: 15

## 2017-07-06 ENCOUNTER — Telehealth: Payer: Self-pay | Admitting: Oncology

## 2017-07-06 ENCOUNTER — Inpatient Hospital Stay: Payer: 59 | Attending: Oncology | Admitting: Oncology

## 2017-07-06 ENCOUNTER — Encounter: Payer: Self-pay | Admitting: Oncology

## 2017-07-06 VITALS — BP 123/80 | HR 80 | Temp 98.5°F | Resp 18 | Wt 116.9 lb

## 2017-07-06 DIAGNOSIS — F329 Major depressive disorder, single episode, unspecified: Secondary | ICD-10-CM | POA: Diagnosis not present

## 2017-07-06 DIAGNOSIS — I10 Essential (primary) hypertension: Secondary | ICD-10-CM

## 2017-07-06 DIAGNOSIS — Z809 Family history of malignant neoplasm, unspecified: Secondary | ICD-10-CM | POA: Insufficient documentation

## 2017-07-06 DIAGNOSIS — Z79899 Other long term (current) drug therapy: Secondary | ICD-10-CM | POA: Diagnosis not present

## 2017-07-06 DIAGNOSIS — Z87891 Personal history of nicotine dependence: Secondary | ICD-10-CM | POA: Diagnosis not present

## 2017-07-06 DIAGNOSIS — R011 Cardiac murmur, unspecified: Secondary | ICD-10-CM | POA: Insufficient documentation

## 2017-07-06 DIAGNOSIS — K219 Gastro-esophageal reflux disease without esophagitis: Secondary | ICD-10-CM | POA: Diagnosis not present

## 2017-07-06 DIAGNOSIS — D751 Secondary polycythemia: Secondary | ICD-10-CM | POA: Diagnosis not present

## 2017-07-06 NOTE — Progress Notes (Signed)
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 Etiology unclear  Chief complaint/ Reason for visit- discuss results of bloodwork  Heme/Onc history: patient is a 46 year old female with a past medical history significant for depression for which she is on Wellbutrin. She has been referred to [place] for elevated hemoglobin. Most recent CBC from 05/19/2017 showed white count of 4.3, H&H of 17/49.8 and platelet count of 277. In March 2017 her hemoglobin was 15.9/46.7. She had a normal hemoglobin until 2009 when her hemoglobin was around 12  Patient feels well and denies any complaints. She smoked in the remote past but has not smoked over the last 20 years. She reports getting a good night's sleep and does not wake up multiple times at night. Denies any fatigue, unintentional weight loss or drenching night sweats.no family h/o blood disorders  Results of blood work from 06/20/2017 were as follows: CBC showed white count of 3.4, H&H of 16.3/45.6 with a platelet count of 244. EPO level was normal at 8.2. Urinalysis was negative for hematuria. Jak 2 as well as exon 12 testing was negative   Interval history- she is doing well. Denies any complaints  ECOG PS- 0 Pain scale- 0  Review of systems- Review of Systems  Constitutional: Negative for chills, fever, malaise/fatigue and weight loss.  HENT: Negative for congestion, ear discharge and nosebleeds.   Eyes: Negative for blurred vision.  Respiratory: Negative for cough, hemoptysis, sputum production, shortness of breath and wheezing.   Cardiovascular: Negative for chest pain, palpitations, orthopnea and claudication.  Gastrointestinal: Negative for abdominal pain, blood  in stool, constipation, diarrhea, heartburn, melena, nausea and vomiting.  Genitourinary: Negative for dysuria, flank pain, frequency, hematuria and urgency.  Musculoskeletal: Negative for back pain, joint pain and myalgias.  Skin: Negative for rash.  Neurological: Negative for dizziness, tingling, focal weakness, seizures, weakness and headaches.  Endo/Heme/Allergies: Does not bruise/bleed easily.  Psychiatric/Behavioral: Negative for depression and suicidal ideas. The patient does not have insomnia.        No Known Allergies   Past Medical History:  Diagnosis Date  . Chickenpox   . Depression   . GERD (gastroesophageal reflux disease)   . Headache   . Heart murmur   . High blood pressure   . Preeclampsia      Past Surgical History:  Procedure Laterality Date  . No surgical history    . UTERINE SEPTUM RESECTION  2008    Social History   Social History  . Marital status: Married    Spouse name: N/A  . Number of children: N/A  . Years of education: N/A   Occupational History  . Not on file.   Social History Main Topics  . Smoking status: Former Research scientist (life sciences)  . Smokeless tobacco: Never Used  . Alcohol use 0.6 oz/week    1 Standard drinks or equivalent per week  . Drug use: No  . Sexual activity: Not on file   Other Topics Concern  . Not on file   Social History Narrative  . No narrative on file    Family History  Problem Relation Age of Onset  . Arthritis Unknown        Parent, Grandparent  . Heart disease Unknown  Parent, grandparent  . High blood pressure Unknown        Parent  . Stroke Unknown        Grandparent  . Diabetes Unknown        Parent  . Cancer Father      Current Outpatient Prescriptions:  .  buPROPion (WELLBUTRIN XL) 300 MG 24 hr tablet, Take 1 tablet (300 mg total) by mouth daily., Disp: 90 tablet, Rfl: 2  Physical exam:  Vitals:   07/06/17 0929  BP: 123/80  Pulse: 80  Resp: 18  Temp: 98.5 F (36.9 C)  TempSrc:  Tympanic  Weight: 116 lb 14.4 oz (53 kg)   Physical Exam  Constitutional: She is oriented to person, place, and time and well-developed, well-nourished, and in no distress.  HENT:  Head: Normocephalic and atraumatic.  Eyes: Pupils are equal, round, and reactive to light. EOM are normal.  Neck: Normal range of motion.  Cardiovascular: Normal rate, regular rhythm and normal heart sounds.   Pulmonary/Chest: Effort normal and breath sounds normal.  Abdominal: Soft. Bowel sounds are normal.  Neurological: She is alert and oriented to person, place, and time.  Skin: Skin is warm and dry.     CMP Latest Ref Rng & Units 05/19/2017  Glucose 70 - 99 mg/dL 109(H)  BUN 6 - 23 mg/dL 22  Creatinine 0.40 - 1.20 mg/dL 1.07  Sodium 135 - 145 mEq/L 140  Potassium 3.5 - 5.1 mEq/L 3.6  Chloride 96 - 112 mEq/L 97  CO2 19 - 32 mEq/L 35(H)  Calcium 8.4 - 10.5 mg/dL 10.5  Total Protein 6.0 - 8.3 g/dL 7.8  Total Bilirubin 0.2 - 1.2 mg/dL 0.6  Alkaline Phos 39 - 117 U/L 59  AST 0 - 37 U/L 22  ALT 0 - 35 U/L 12   CBC Latest Ref Rng & Units 06/20/2017  WBC 3.6 - 11.0 K/uL 3.4(L)  Hemoglobin 12.0 - 16.0 g/dL 16.3(H)  Hematocrit 35.0 - 47.0 % 45.6  Platelets 150 - 440 K/uL 244      Assessment and plan- Patient is a 46 y.o. 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 Patient has not smoked for many years and history not suggestive for OSA. CXR pending. Normal EPO levels also argue against tumor associated polycythemia. I will hold off on bone marrow biopsy at this time. Will repeat cbc and carboxyhb in 3 months time and see eherr back in 6 months. If H/H keeps trending up, will consider bone marrow biopsy. Other possible etologies for secondary polycythemia could be high affinity hemoglobin or possible undiagnosed OSA. Will consider phlebotomy if hct >50-55   Visit Diagnosis 1. Polycythemia,  secondary      Dr. Randa Evens, MD, MPH [facility] at [facility] Pager- [phone] 07/06/2017  11:22 AM

## 2017-07-06 NOTE — Telephone Encounter (Signed)
Lab, f/u scheduled per 07/06/17 los. Patient was given a copy of the AVS report and appointment schedule, per 07/06/17 los.

## 2017-07-06 NOTE — Progress Notes (Signed)
Here for follow up

## 2017-07-11 ENCOUNTER — Encounter: Payer: Self-pay | Admitting: *Deleted

## 2017-08-02 ENCOUNTER — Ambulatory Visit
Admission: RE | Admit: 2017-08-02 | Discharge: 2017-08-02 | Disposition: A | Discharge status: Home / Self Care | Payer: 59 | Source: Ambulatory Visit | Attending: Family Medicine | Admitting: Family Medicine

## 2017-08-02 DIAGNOSIS — Z1231 Encounter for screening mammogram for malignant neoplasm of breast: Secondary | ICD-10-CM | POA: Insufficient documentation

## 2017-08-02 DIAGNOSIS — Z1239 Encounter for other screening for malignant neoplasm of breast: Secondary | ICD-10-CM

## 2017-08-04 ENCOUNTER — Telehealth: Payer: Self-pay | Admitting: Family Medicine

## 2017-08-04 ENCOUNTER — Other Ambulatory Visit: Payer: Self-pay | Admitting: Family Medicine

## 2017-08-04 DIAGNOSIS — R928 Other abnormal and inconclusive findings on diagnostic imaging of breast: Secondary | ICD-10-CM

## 2017-08-04 NOTE — Telephone Encounter (Signed)
Spoke with patient regarding mammogram results. Advised that there was an abnormality in the left breast that they're going to evaluate further. She notes she is going out of town in a week and a half to [place]. We'll forward this message to our referral coordinator to contact the breast Center to see if they can get this done before she leaves town.

## 2017-08-07 NOTE — Telephone Encounter (Signed)
lvm for samantha @ [place] to rtc

## 2017-08-08 NOTE — Telephone Encounter (Signed)
She is scheduled on 8/31

## 2017-08-25 ENCOUNTER — Ambulatory Visit
Admission: RE | Admit: 2017-08-25 | Discharge: 2017-08-25 | Disposition: A | Discharge status: Home / Self Care | Payer: 59 | Source: Ambulatory Visit | Attending: Family Medicine | Admitting: Family Medicine

## 2017-08-25 DIAGNOSIS — N6489 Other specified disorders of breast: Secondary | ICD-10-CM | POA: Insufficient documentation

## 2017-08-25 DIAGNOSIS — N6002 Solitary cyst of left breast: Secondary | ICD-10-CM | POA: Insufficient documentation

## 2017-08-25 DIAGNOSIS — R928 Other abnormal and inconclusive findings on diagnostic imaging of breast: Secondary | ICD-10-CM | POA: Diagnosis present

## 2017-10-03 ENCOUNTER — Inpatient Hospital Stay: Payer: 59 | Attending: Oncology | Admitting: *Deleted

## 2017-10-03 DIAGNOSIS — D751 Secondary polycythemia: Secondary | ICD-10-CM | POA: Diagnosis present

## 2017-10-03 DIAGNOSIS — D582 Other hemoglobinopathies: Secondary | ICD-10-CM

## 2017-10-03 DIAGNOSIS — Z87891 Personal history of nicotine dependence: Secondary | ICD-10-CM | POA: Insufficient documentation

## 2017-10-03 LAB — CBC WITH DIFFERENTIAL/PLATELET
Basophils Absolute: 0 10*3/uL (ref 0–0.1)
Basophils Relative: 1 %
Eosinophils Absolute: 0 10*3/uL (ref 0–0.7)
Eosinophils Relative: 1 %
HCT: 45.4 % (ref 35.0–47.0)
Hemoglobin: 15.9 g/dL (ref 12.0–16.0)
Lymphocytes Relative: 19 %
Lymphs Abs: 0.9 10*3/uL — ABNORMAL LOW (ref 1.0–3.6)
MCH: 32.4 pg (ref 26.0–34.0)
MCHC: 35.1 g/dL (ref 32.0–36.0)
MCV: 92.1 fL (ref 80.0–100.0)
Monocytes Absolute: 0.4 10*3/uL (ref 0.2–0.9)
Monocytes Relative: 9 %
Neutro Abs: 3.5 10*3/uL (ref 1.4–6.5)
Neutrophils Relative %: 70 %
Platelets: 250 10*3/uL (ref 150–440)
RBC: 4.93 MIL/uL (ref 3.80–5.20)
RDW: 12.6 % (ref 11.5–14.5)
WBC: 4.9 10*3/uL (ref 3.6–11.0)

## 2017-10-04 LAB — CARBON MONOXIDE, BLOOD (PERFORMED AT REF LAB): Carbon Monoxide, Blood: 2.6 % (ref 0.0–3.6)

## 2017-11-10 ENCOUNTER — Encounter: Payer: Self-pay | Admitting: *Deleted

## 2017-11-10 ENCOUNTER — Telehealth: Payer: Self-pay | Admitting: Family Medicine

## 2017-11-10 NOTE — Telephone Encounter (Signed)
Copied from CRM [id number]. Topic: Appointment Scheduling - Scheduling Inquiry for Clinic >> Nov 10, 2017 10:30 AM Raquel SarnaHayes, Teresa G wrote:  Pt is having severe back pain and needs a visit and a referral.  Dr. Birdie SonsSonnenberg has no appts available. Pt can come in: Nov 28 29 Dec. anytime

## 2017-11-10 NOTE — Telephone Encounter (Signed)
Pt's husband calling on her behalf due to the pt being in [place] at this time. Pt's husband is unable to provide all he details of the severity of the pt's pain, but states he knows it is in her lower back and sometimes when she bends over she has to have a hand with standing back up. Pt's husband to call back to schedule appt. Earliest availability is in December, and wants to check with pt before scheduling appt. Pt's husband states that the pt needs a referral for PT, which is why they want to get her in for a office visit.

## 2017-11-10 NOTE — Telephone Encounter (Signed)
This encounter was created in error - please disregard.

## 2017-11-10 NOTE — Telephone Encounter (Signed)
Please have patient triaged. Need to know severity of pain, duration, location of pain, etc.

## 2017-11-10 NOTE — Telephone Encounter (Signed)
Unable to triage pt at this time due to pt being in [place], and pt's husband stating that her phone connection was not stable at times.

## 2017-11-13 NOTE — Telephone Encounter (Signed)
Patient has been scheduled for December 11 with Dr. Birdie SonsSonnenberg

## 2017-12-05 ENCOUNTER — Ambulatory Visit: Payer: 59 | Admitting: Family Medicine

## 2017-12-08 ENCOUNTER — Other Ambulatory Visit: Payer: Self-pay

## 2017-12-08 ENCOUNTER — Ambulatory Visit (INDEPENDENT_AMBULATORY_CARE_PROVIDER_SITE_OTHER): Payer: 59

## 2017-12-08 ENCOUNTER — Encounter: Payer: Self-pay | Admitting: Family Medicine

## 2017-12-08 ENCOUNTER — Ambulatory Visit (INDEPENDENT_AMBULATORY_CARE_PROVIDER_SITE_OTHER): Payer: 59 | Admitting: Family Medicine

## 2017-12-08 VITALS — BP 130/86 | HR 75 | Temp 97.6°F | Wt 121.6 lb

## 2017-12-08 DIAGNOSIS — M545 Low back pain, unspecified: Secondary | ICD-10-CM | POA: Insufficient documentation

## 2017-12-08 DIAGNOSIS — M533 Sacrococcygeal disorders, not elsewhere classified: Secondary | ICD-10-CM

## 2017-12-08 DIAGNOSIS — F321 Major depressive disorder, single episode, moderate: Secondary | ICD-10-CM | POA: Diagnosis not present

## 2017-12-08 DIAGNOSIS — G8929 Other chronic pain: Secondary | ICD-10-CM

## 2017-12-08 DIAGNOSIS — R928 Other abnormal and inconclusive findings on diagnostic imaging of breast: Secondary | ICD-10-CM | POA: Insufficient documentation

## 2017-12-08 NOTE — Patient Instructions (Signed)
Nice to see you. We will get to do physical therapy for your back.  You can take occasional Aleve over-the-counter for this.  You should take this with food. We will get you set up for your follow-up mammogram. If you do not hear anything regarding these in the next several weeks please let us know.

## 2017-12-08 NOTE — Assessment & Plan Note (Signed)
>>  ASSESSMENT AND PLAN FOR DEPRESSION, MAJOR, SINGLE EPISODE, MODERATE (HCC) WRITTEN ON 12/08/2017 11:50 AM BY SONNENBERG, ERIC G, MD  Well-controlled.  Continue current regimen.

## 2017-12-08 NOTE — Assessment & Plan Note (Signed)
Follow-up imaging ordered today.  This is due in February.

## 2017-12-08 NOTE — Assessment & Plan Note (Signed)
Suspect SI joint dysfunction versus degenerative changes in her lumbar spine.  Will obtain an x-ray.  Will refer for physical therapy.  She can take Aleve over-the-counter as needed.  Take with food.

## 2017-12-08 NOTE — Progress Notes (Signed)
Misty AlarEric Sonnenberg, MD Phone: [phone]  Misty RazorDonna R Fernandez is a 46 y.o. female who presents today for follow-up.  Chronic low back pain: Patient notes chronic history of this though over the last 6 months it is not been improving.  Notes it occurs in the left SI area.  Sneezing makes it hurt more.  Bending and reaching causes it to hurt more.  Sitting to standing as well.  No numbness, weakness, loss of bowel or bladder function, saddle anesthesia, or radiation.  She has used ice and topical treatments.  Did have an abnormal mammogram previously.  They recommended six-month follow-up.  She is due for this in February.  She has not noticed any breast changes.  Depression: She notes increasing the Wellbutrin helped significantly.  She notes no depressive symptoms.  No SI.  Social History   Tobacco Use  Smoking Status Former Smoker  Smokeless Tobacco Never Used     ROS see history of present illness  Objective  Physical Exam Vitals:   12/08/17 1049  BP: 130/86  Pulse: 75  Temp: 97.6 F (36.4 C)  SpO2: 98%    BP Readings from Last 3 Encounters:  12/08/17 130/86  07/06/17 123/80  06/20/17 136/84   Wt Readings from Last 3 Encounters:  12/08/17 121 lb 9.6 oz (55.2 kg)  07/06/17 116 lb 14.4 oz (53 kg)  06/20/17 114 lb 5 oz (51.9 kg)    Physical Exam  Constitutional: No distress.  Cardiovascular: Normal rate, regular rhythm and normal heart sounds.  Pulmonary/Chest: Effort normal and breath sounds normal.  Musculoskeletal:  No midline spine tenderness, no midline spine step-off, no muscular back tenderness, no overlying skin changes  Neurological: She is alert. Gait normal.  5/5 strength bilateral quads, hamstrings, plantar flexion, and dorsiflexion, sensation light touch intact bilateral lower extremities  Skin: Skin is warm and dry. She is not diaphoretic.     Assessment/Plan: Please see individual problem list.  Depression, major, single episode, moderate  (HCC) Well-controlled.  Continue current regimen.  Chronic left-sided low back pain without sciatica Suspect SI joint dysfunction versus degenerative changes in her lumbar spine.  Will obtain an x-ray.  Will refer for physical therapy.  She can take Aleve over-the-counter as needed.  Take with food.   Abnormal mammogram Follow-up imaging ordered today.  This is due in February.   Misty LeashDonna was seen today for back pain.  Diagnoses and all orders for this visit:  SI (sacroiliac) joint dysfunction -     Ambulatory referral to Physical Therapy  Abnormal mammogram -     MM DIAG BREAST TOMO UNI LEFT; Future -     US BREAST LTD UNI LEFT INC AXILLA; Future  Chronic left-sided low back pain without sciatica -     DG Lumbar Spine Complete; Future  Depression, major, single episode, moderate (HCC)    Orders Placed This Encounter  Procedures  . MM DIAG BREAST TOMO UNI LEFT    Standing Status:   Future    Standing Expiration Date:   02/08/2019    Order Specific Question:   Reason for Exam (SYMPTOM  OR DIAGNOSIS REQUIRED)    Answer:   prior abnormal mammogram, 6 month follow-up    Order Specific Question:   Is the patient pregnant?    Answer:   No    Order Specific Question:   Preferred imaging location?    Answer:   [facility]  . US BREAST LTD UNI LEFT INC AXILLA    Standing Status:  Future    Standing Expiration Date:   02/08/2019    Order Specific Question:   Reason for Exam (SYMPTOM  OR DIAGNOSIS REQUIRED)    Answer:   prior abnormal mammogram, 6 month follow-up    Order Specific Question:   Preferred imaging location?    Answer:   [facility]  . DG Lumbar Spine Complete    Standing Status:   Future    Number of Occurrences:   1    Standing Expiration Date:   02/08/2019    Order Specific Question:   Reason for Exam (SYMPTOM  OR DIAGNOSIS REQUIRED)    Answer:   chronic left low back pain and SI area pain    Order Specific Question:   Is patient pregnant?    Answer:    No    Order Specific Question:   Preferred imaging location?    Answer:   [facility]    Order Specific Question:   Radiology Contrast Protocol - do NOT remove file path    Answer:   [email or web address]  . Ambulatory referral to Physical Therapy    Referral Priority:   Routine    Referral Type:   Physical Medicine    Referral Reason:   Specialty Services Required    Requested Specialty:   Physical Therapy    Number of Visits Requested:   1    No orders of the defined types were placed in this encounter.    Misty AlarEric Sonnenberg, MD [facility]

## 2017-12-08 NOTE — Assessment & Plan Note (Signed)
Well-controlled.  Continue current regimen. 

## 2017-12-22 ENCOUNTER — Encounter: Payer: Self-pay | Admitting: Family Medicine

## 2017-12-22 MED ORDER — BUPROPION HCL ER (XL) 300 MG PO TB24: 300.0000 mg | ORAL_TABLET | Freq: Every day | ORAL | 3 refills | Status: DC

## 2017-12-22 NOTE — Telephone Encounter (Signed)
Sent in 30 day supply [per patient request due to insurance

## 2018-01-02 ENCOUNTER — Ambulatory Visit: Payer: 59 | Attending: Family Medicine | Admitting: Physical Therapy

## 2018-01-02 ENCOUNTER — Other Ambulatory Visit: Payer: Self-pay

## 2018-01-02 ENCOUNTER — Encounter: Payer: Self-pay | Admitting: Physical Therapy

## 2018-01-02 DIAGNOSIS — M545 Low back pain: Secondary | ICD-10-CM | POA: Insufficient documentation

## 2018-01-02 DIAGNOSIS — G8929 Other chronic pain: Secondary | ICD-10-CM | POA: Diagnosis present

## 2018-01-02 DIAGNOSIS — M6281 Muscle weakness (generalized): Secondary | ICD-10-CM | POA: Insufficient documentation

## 2018-01-02 NOTE — Therapy (Signed)
[facility] PHYSICAL AND SPORTS MEDICINE [address] [place], [place], [address] Phone: [phone]   Fax:  [phone]  Physical Therapy Evaluation  Patient Details  Name: Misty Fernandez MRN: [id number] Date of Birth: Sep 06, 1971 Referring Provider: Glori LuisEric G Sonnenberg, MD   Encounter Date: 01/02/2018  PT End of Session - 01/02/18 0940    Visit Number  1    Number of Visits  9    Date for PT Re-Evaluation  01/30/18    PT Start Time  0847    PT Stop Time  0940    PT Time Calculation (min)  53 min    Activity Tolerance  Patient tolerated treatment well    Behavior During Therapy  [facility] for tasks assessed/performed       Past Medical History:  Diagnosis Date  . Chickenpox   . Depression   . GERD (gastroesophageal reflux disease)   . Headache   . Heart murmur   . High blood pressure   . Preeclampsia     Past Surgical History:  Procedure Laterality Date  . No surgical history    . UTERINE SEPTUM RESECTION  2008    There were no vitals filed for this visit.   Subjective Assessment - 01/02/18 0901    Subjective  Pt is a 47 y/o F who presents with chronic LBP    Pertinent History  Pt presents with chronic LBP (at least 5 years) which is mainly on the L side but does span her entire lower back. Over past few years pt has had increasing levels of issues with back pain. When pt has had flare ups in the past they typically have always self resolved with time. Since last April pt has had a significant period where "it feels like almost every activity was painful". Since flare up last April her pain has gotten better some but not significantly better. Pt typically is very active exercising 3-4x/wk with her personal trainer but she has not exercised for the past month as her personal trainer is currently relocating. Aggravating factors: bending over, SLS when donning pants, sneezing, sit>stand, reaching. Easing factors: ice, aleve (pt doesn't like  doing this). Heat does not help. No MRI. X-ray on 12/08/17: mild scoliosis concave L with diffuse mild degenerative change. No acute bony abnormality identified. SI joints intact. Left nephrolithiasis cannot be excluded. Denies changes in bowel or bladder, night sweats, sudden changes in weight. Denies any symptoms down her legs. No numbness or tingling. 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 When pt has increase in pain from an activity then her pain goes back down to her baseline ache almost immediately after getting out of that position. No pain with sitting or standing. Every now and then walking is painful. Pt's hobbies include exercising, reading, knitting. Current pain: 3-4/10, best 2-3/10, worst 6-7/10. Pt has seen a chiropractor in the past with some success. Saw a chiropractor again recently and did not see any results.     How long can you sit comfortably?  unlimited    How long can you stand comfortably?  unlimited    How long can you walk comfortably?  unlimited    Diagnostic tests  X-ray on 12/08/17: mild scoliosis concave L with diffuse mild degenerative change. No acute bony abnormality identified. SI joints intact. Left nephrolithiasis cannot be  excluded.     Patient Stated Goals  to be able to perform daily activities and work duties painfree.  To be able to complete gym routine without pain.    Currently in Pain?  Yes    Pain Score  4     Pain Location  Back    Pain Orientation  Lower    Pain Descriptors / Indicators  Aching    Pain Type  Chronic pain    Pain Onset  More than a month ago    Pain Frequency  Constant    Aggravating Factors   see above    Pain Relieving Factors  see above    Effect of Pain on Daily Activities  see above    Multiple Pain Sites  No         [facility] PT Assessment - 01/02/18 0905      Assessment   Medical  Diagnosis  SI joint dysfunction    Referring Provider  Glori Luis, MD    Onset Date/Surgical Date  -- "at least 5 years ago"    Hand Dominance  Right    Next MD Visit  May 2019    Prior Therapy  No      Precautions   Precautions  None      Restrictions   Weight Bearing Restrictions  No      Balance Screen   Has the patient fallen in the past 6 months  Yes chair moved from under her    How many times?  1    Has the patient had a decrease in activity level because of a fear of falling?   No    Is the patient reluctant to leave their home because of a fear of falling?   No      Home Public house manager residence    Living Arrangements  -- "with family"    Available Help at Discharge  Family    Type of Home  House    Home Access  Stairs to enter    Entrance Stairs-Number of Steps  5-6    Entrance Stairs-Rails  None    Home Layout  One level    Home Equipment  None      Prior Function   Level of Independence  Independent      Cognition   Overall Cognitive Status  Within Functional Limits for tasks assessed      ROM / Strength   AROM / PROM / Strength  Strength      Strength   Overall Strength  Deficits    Strength Assessment Site  Hip;Knee;Ankle    Right/Left Hip  Right;Left    Right Hip Flexion  5/5    Right Hip Extension  3/5    Right Hip External Rotation   5/5    Right Hip Internal Rotation  5/5    Right Hip ABduction  3+/5    Left Hip Flexion  5/5    Left Hip Extension  3/5    Left Hip External Rotation  5/5    Left Hip Internal Rotation  5/5    Left Hip ABduction  3+/5    Right/Left Knee  Right;Left    Right Knee Flexion  5/5    Right Knee Extension  5/5    Left Knee Flexion  5/5    Left Knee Extension  5/5    Right/Left Ankle  Right;Left    Right Ankle Dorsiflexion  5/5    Left Ankle Dorsiflexion  5/5       EXAMINATION   Outcome measures were completed and results explained to the patient:  mODI: 26%  FABQw: 8/42   FABQpa: 14/24   Palpation: Significant increased tension R lumbar paraspinals, pt reported relief of back pain with STM to Bil glute max   Trunk AROM (L, R):  F: WNL but painful throughout entire motion  E: WNL and painfree  Rotation: 50% pain in Bil lower back, 75% pain in R lower back  Lateral F: 75% pain in Bil lower back and up back some, 100% pain in R lower back   Repeated extension in standing: no change in symptoms   Scour Test: Negative Bil  SLR Test: Negative Bil  FABER Test: Negative Bil  FADDIR: Negative Bil  SIJ Compression and Distraction Test: Negative Bil  Slump Test: Negative Bil  Joint mobility: hypomobile L4-5, T6-12   Rhomboids and lower trap strength: 3/5 Bil     TREATMENT  Theraball forward rolls in sitting for low back stretch x5 with 10 second holds. (added to HEP)  Theraball rolls to L in sitting for low back stretch x5 with 10 second holds. (added to HEP)  Theraball rolls to R in sitting for low back stretch x5 with 10 second holds. (added to HEP)  Monster walks with RTB around ankles x10 ft x5. Challenging for the pt. (added to HEP)       Objective measurements completed on examination: See above findings.              PT Education - 01/02/18 [phone]    Education provided  Yes    Education Details  POC; role of PT; HEP and handout; exercise technique    Person(s) Educated  Patient    Methods  Explanation;Handout;Verbal cues;Demonstration    Comprehension  Verbalized understanding;Returned demonstration;Verbal cues required;Need further instruction       PT Short Term Goals - 01/02/18 0943      PT SHORT TERM GOAL #1   Title  Pt will be independent with HEP for carryover between sessions    Baseline  2    Period  Weeks    Status  New        PT Long Term Goals - 01/02/18 0946      PT LONG TERM GOAL #1   Title  Pt will improve Bil glute max and med strength to at least 4+/5 for improved functional use of each LE and to  support lower back    Baseline  4    Period  Weeks    Status  New      PT LONG TERM GOAL #2   Title  Pt will improve mODI to at least 16% to demonstrate decreased effect of back pain on functional activities    Baseline  26%    Time  4    Period  Weeks    Status  New      PT LONG TERM GOAL #3   Title  Pt will be able to perform daily activities (bending, sit>stand, reaching, donning pants) painfree for improved QOL    Baseline  painful    Time  3    Period  Weeks    Status  New      PT LONG TERM GOAL #4   Title  Pt will improve FABQpa to at least 6/24 to demonstrate decreased fear of pain with activities    Baseline  14/24    Time  4    Period  Weeks    Status  New             Plan - 01/02/18 0940    Clinical Impression Statement  Pt is a 48 y/o F who presents with chronic LBP with flare up since last April.  Pt demonstrates significant tension in R lumbar paraspinals upon palpation.  She is hypomobile with CPAs to L4-5 and T6-12.  Pt demonstrates significant weakness in Bil glute med and max.  Pt's back pain is having an effect on her functional activities as evidenced by her mODI score.  Additionally, pt's fear of back pain is effecting her activities as well as indicated by her FABQpa score.  Introduced Presenter, broadcasting for stretching of lower back and glute med strengthening exercise which pt responded well to.  These were added to her HEP this date.  Pt will benefit from skilled PT interventions for decreased pain and improved QOL.     History and Personal Factors relevant to plan of care:  (+) pt young and motivated to improve function for return to exercise routine (-) chronicity of pain    Clinical Presentation  Stable    Clinical Presentation due to:  Pt's pain is chornic and has been constant with predictable flare ups with mechanical origin    Clinical Decision Making  Low    Rehab Potential  Good    PT Frequency  2x / week    PT Duration  4 weeks    PT  Treatment/Interventions  ADLs/Self Care Home Management;Aquatic Therapy;Biofeedback;Cryotherapy;Electrical Stimulation;Iontophoresis 4mg /ml Dexamethasone;Moist Heat;Traction;Ultrasound;Contrast Bath;DME Instruction;Gait training;Stair training;Functional mobility training;Therapeutic exercise;Therapeutic activities;Balance training;Neuromuscular re-education;Patient/family education;Orthotic Fit/Training;Manual techniques;Passive range of motion;Dry needling;Energy conservation;Taping    PT Next Visit Plan  Progress glute strengthening, spinal joint mobilizations, stretching to low back, STM R lumbar paraspinals, strengthening lower trap and rhomboids    PT Home Exercise Plan  prayer stretch forward left and right, monster walks with band around ankles    Recommended Other Services  none at this time    Consulted and Agree with Plan of Care  Patient       Patient will benefit from skilled therapeutic intervention in order to improve the following deficits and impairments:  Abnormal gait, Decreased balance, Decreased endurance, Decreased range of motion, Decreased strength, Difficulty walking, Hypomobility, Increased fascial restricitons, Increased muscle spasms, Impaired perceived functional ability, Improper body mechanics, Postural dysfunction, Pain  Visit Diagnosis: Chronic bilateral low back pain without sciatica  Muscle weakness (generalized)     Problem List Patient Active Problem List   Diagnosis Date Noted  . Chronic left-sided low back pain without sciatica 12/08/2017  . Abnormal mammogram 12/08/2017  . Elevated hemoglobin (HCC) 06/20/2017  . Depression, major, single episode, moderate (HCC) 04/28/2017  . Abnormal uterine bleeding 03/01/2016    Encarnacion Chu PT, DPT 01/02/2018, 10:15 AM  [facility] PHYSICAL AND SPORTS MEDICINE [address], [place], [address] Phone: [phone]   Fax:  [phone]  Name: Misty Fernandez MRN:  [id number] Date of Birth: Sep 23, 1971

## 2018-01-08 ENCOUNTER — Ambulatory Visit: Payer: 59 | Admitting: Physical Therapy

## 2018-01-08 ENCOUNTER — Encounter: Payer: Self-pay | Admitting: Physical Therapy

## 2018-01-08 DIAGNOSIS — M545 Low back pain: Secondary | ICD-10-CM | POA: Diagnosis not present

## 2018-01-08 DIAGNOSIS — G8929 Other chronic pain: Secondary | ICD-10-CM

## 2018-01-08 DIAGNOSIS — M6281 Muscle weakness (generalized): Secondary | ICD-10-CM

## 2018-01-08 NOTE — Therapy (Signed)
[facility] [facility] PHYSICAL AND SPORTS MEDICINE [address] [place], [place], [address] Phone: [phone]   Fax:  [phone]  Physical Therapy Treatment  Patient Details  Name: Misty Fernandez MRN: [id number] Date of Birth: [birth year] Referring Provider: Glori LuisEric G Sonnenberg, MD   Encounter Date: 01/08/2018  PT End of Session - 01/08/18 0850    Visit Number  2    Number of Visits  9    Date for PT Re-Evaluation  01/30/18    PT Start Time  0849    PT Stop Time  0927    PT Time Calculation (min)  38 min    Activity Tolerance  Patient tolerated treatment well    Behavior During Therapy  [facility] for tasks assessed/performed       Past Medical History:  Diagnosis Date  . Chickenpox   . Depression   . GERD (gastroesophageal reflux disease)   . Headache   . Heart murmur   . High blood pressure   . Preeclampsia     Past Surgical History:  Procedure Laterality Date  . No surgical history    . UTERINE SEPTUM RESECTION  2008    There were no vitals filed for this visit.  Subjective Assessment - 01/08/18 0852    Subjective  Pt reports she had pain over the weekend but cannot think of why.  Pt completed her HEP with no questions or concerns.     Pertinent History  Pt presents with chronic LBP (at least 5 years) which is mainly on the L side but does span her entire lower back. Over past few years pt has had increasing levels of issues with back pain. When pt has had flare ups in the past they typically have always self resolved with time. Since last April pt has had a significant period where "it feels like almost every activity was painful". Since flare up last April her pain has gotten better some but not significantly better. Pt typically is very active exercising 3-4x/wk with her personal trainer but she has not exercised for the past month as her personal trainer is currently relocating. Aggravating factors: bending over, SLS when donning pants,  sneezing, sit>stand, reaching. Easing factors: ice, aleve (pt doesn't like doing this). Heat does not help. No MRI. X-ray on 12/08/17: mild scoliosis concave L with diffuse mild degenerative change. No acute bony abnormality identified. SI joints intact. Left nephrolithiasis cannot be excluded. Denies changes in bowel or bladder, night sweats, sudden changes in weight. Denies any symptoms down her legs. No numbness or tingling. 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 When pt has increase in pain from an activity then her pain goes back down to her baseline ache almost immediately after getting out of that position. No pain with sitting or standing. Every now and then walking is painful. Pt's hobbies include exercising, reading, knitting. Current pain: 3-4/10, best 2-3/10, worst 6-7/10. Pt has seen a chiropractor in the past with some success. Saw a chiropractor again recently and did not see any results.     How long can you sit comfortably?  unlimited    How long can you stand comfortably?  unlimited    How long can you walk comfortably?  unlimited    Diagnostic tests  X-ray on 12/08/17: mild scoliosis concave L with diffuse mild degenerative change.  No acute bony abnormality identified. SI joints intact. Left nephrolithiasis cannot be excluded.     Patient Stated Goals  to be able to perform daily activities and work duties painfree.  To be able to complete gym routine without pain.    Currently in Pain?  Yes    Pain Score  2     Pain Location  Back    Pain Orientation  Left    Pain Descriptors / Indicators  Aching    Pain Type  Chronic pain    Pain Onset  More than a month ago    Multiple Pain Sites  No        TREATMENT  Theraball forward rolls in sitting for low back stretch x5 with 10 second holds.  Theraball rolls to L in sitting for low back stretch x5  with 10 second holds.  Theraball rolls to R in sitting for low back stretch x5 with 10 second holds.  CPA L4-5, T6-12 Grade I-II x30 seconds each level. Grade III-IV x30 seconds each level  Seated repeated thoracic extension with towel roll x20  Prone lower trap strengthening exercise in prone 2x10 each UE (added to HEP)  Scapular retraction in standing with GTB 3x10 (added to HEP)   Bending/lifting technique assessment: Pt initiating movement from lumbar spine when bending over. Instructed pt in proper form for hip hinge and bending knees rather than flexing at lumbar spine. Pt performed golfer's lift with proper technique for when she lifts lighter objects.   Paloff press at [employer] with 10# 4x10 with 3 second holds                       PT Education - 01/08/18 0849    Education provided  Yes    Education Details  Exercise technique    Person(s) Educated  Patient    Methods  Explanation;Demonstration;Verbal cues    Comprehension  Verbalized understanding;Returned demonstration;Verbal cues required;Need further instruction       PT Short Term Goals - 01/02/18 0943      PT SHORT TERM GOAL #1   Title  Pt will be independent with HEP for carryover between sessions    Baseline  2    Period  Weeks    Status  New        PT Long Term Goals - 01/02/18 0946      PT LONG TERM GOAL #1   Title  Pt will improve Bil glute max and med strength to at least 4+/5 for improved functional use of each LE and to support lower back    Baseline  4    Period  Weeks    Status  New      PT LONG TERM GOAL #2   Title  Pt will improve mODI to at least 16% to demonstrate decreased effect of back pain on functional activities    Baseline  26%    Time  4    Period  Weeks    Status  New      PT LONG TERM GOAL #3   Title  Pt will be able to perform daily activities (bending, sit>stand, reaching, donning pants) painfree for improved QOL    Baseline  painful    Time  3     Period  Weeks    Status  New      PT LONG TERM GOAL #4   Title  Pt will improve FABQpa to at least 6/24  to demonstrate decreased fear of pain with activities    Baseline  14/24    Time  4    Period  Weeks    Status  New            Plan - 01/08/18 0912    Clinical Impression Statement  Addressed hypomobility in thoracic in lumbar spine with joint mobilizations with repeated thoracic extension in sitting as follow up.  Introduced posterior back muscular strengthening to address weakness found upon discharge. Assessed and addressed bending and lifting technique. Pt will benefit from continued skilled PT intervention for dec pain and improved QOL.     Rehab Potential  Good    PT Frequency  2x / week    PT Duration  4 weeks    PT Treatment/Interventions  ADLs/Self Care Home Management;Aquatic Therapy;Biofeedback;Cryotherapy;Electrical Stimulation;Iontophoresis 4mg /ml Dexamethasone;Moist Heat;Traction;Ultrasound;Contrast Bath;DME Instruction;Gait training;Stair training;Functional mobility training;Therapeutic exercise;Therapeutic activities;Balance training;Neuromuscular re-education;Patient/family education;Orthotic Fit/Training;Manual techniques;Passive range of motion;Dry needling;Energy conservation;Taping    PT Next Visit Plan  Progress glute strengthening, spinal joint mobilizations, stretching to low back, STM R lumbar paraspinals, strengthening lower trap and rhomboids    PT Home Exercise Plan  prayer stretch forward left and right, monster walks with band around ankles, scapular retraction in standing with GTB, low trap in prone    Consulted and Agree with Plan of Care  Patient       Patient will benefit from skilled therapeutic intervention in order to improve the following deficits and impairments:  Abnormal gait, Decreased balance, Decreased endurance, Decreased range of motion, Decreased strength, Difficulty walking, Hypomobility, Increased fascial restricitons, Increased muscle  spasms, Impaired perceived functional ability, Improper body mechanics, Postural dysfunction, Pain  Visit Diagnosis: Chronic bilateral low back pain without sciatica  Muscle weakness (generalized)     Problem List Patient Active Problem List   Diagnosis Date Noted  . Chronic left-sided low back pain without sciatica 12/08/2017  . Abnormal mammogram 12/08/2017  . Elevated hemoglobin (HCC) 06/20/2017  . Depression, major, single episode, moderate (HCC) 04/28/2017  . Abnormal uterine bleeding 03/01/2016    Misty Fernandez PT, DPT 01/08/2018, 9:28 AM  [place] [facility] PHYSICAL AND SPORTS MEDICINE [address], [place], [address] Phone: [phone]   Fax:  [phone]  Name: Misty Fernandez MRN: [id number] Date of Birth: 25-Jun-1971

## 2018-01-11 ENCOUNTER — Encounter: Payer: Self-pay | Admitting: Physical Therapy

## 2018-01-11 ENCOUNTER — Ambulatory Visit: Payer: 59 | Admitting: Physical Therapy

## 2018-01-11 DIAGNOSIS — M545 Low back pain, unspecified: Secondary | ICD-10-CM

## 2018-01-11 DIAGNOSIS — M6281 Muscle weakness (generalized): Secondary | ICD-10-CM

## 2018-01-11 DIAGNOSIS — G8929 Other chronic pain: Secondary | ICD-10-CM

## 2018-01-11 NOTE — Therapy (Signed)
[facility] PH[facility]ORTS MEDICINE [address] [place], [address] Phone: [phone]   Fax:  [phone]  Physical Therapy Treatment  Patient Details  Name: Misty Fernandez MRN: [id number] Date of Birth: [birth year] Referring Provider: Glori Luis, MD   Encounter Date: 01/11/2018  PT End of Session - 01/11/18 1705    Visit Number  3    Number of Visits  9    Date for PT Re-Evaluation  01/30/18    PT Start Time  1705    PT Stop Time  1748    PT Time Calculation (min)  43 min    Activity Tolerance  Patient tolerated treatment well    Behavior During Therapy  [facility] for tasks assessed/performed       Past Medical History:  Diagnosis Date  . Chickenpox   . Depression   . GERD (gastroesophageal reflux disease)   . Headache   . Heart murmur   . High blood pressure   . Preeclampsia     Past Surgical History:  Procedure Laterality Date  . No surgical history    . UTERINE SEPTUM RESECTION  2008    There were no vitals filed for this visit.  Subjective Assessment - 01/11/18 1707    Subjective  Pt reports she believes her pain has improved some since starting therapy.  Pt reports pain when donning socks and shoes in standing.     Pertinent History  Pt presents with chronic LBP (at least 5 years) which is mainly on the L side but does span her entire lower back. Over past few years pt has had increasing levels of issues with back pain. When pt has had flare ups in the past they typically have always self resolved with time. Since last April pt has had a significant period where "it feels like almost every activity was painful". Since flare up last April her pain has gotten better some but not significantly better. Pt typically is very active exercising 3-4x/wk with her personal trainer but she has not exercised for the past month as her personal trainer is currently relocating. Aggravating factors: bending over, SLS when donning  pants, sneezing, sit>stand, reaching. Easing factors: ice, aleve (pt doesn't like doing this). Heat does not help. No MRI. X-ray on 12/08/17: mild scoliosis concave L with diffuse mild degenerative change. No acute bony abnormality identified. SI joints intact. Left nephrolithiasis cannot be excluded. Denies changes in bowel or bladder, night sweats, sudden changes in weight. Denies any symptoms down her legs. No numbness or tingling. 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 When pt has increase in pain from an activity then her pain goes back down to her baseline ache almost immediately after getting out of that position. No pain with sitting or standing. Every now and then walking is painful. Pt's hobbies include exercising, reading, knitting. Current pain: 3-4/10, best 2-3/10, worst 6-7/10. Pt has seen a chiropractor in the past with some success. Saw a chiropractor again recently and did not see any results.     How long can you sit comfortably?  unlimited    How long can you stand comfortably?  unlimited    How long can you walk comfortably?  unlimited    Diagnostic tests  X-ray on 12/08/17: mild scoliosis concave L with diffuse mild degenerative change.  No acute bony abnormality identified. SI joints intact. Left nephrolithiasis cannot be excluded.     Patient Stated Goals  to be able to perform daily activities and work duties painfree.  To be able to complete gym routine without pain.    Currently in Pain?  No/denies    Pain Onset  More than a month ago        TREATMENT   CPA L4-5, T6-12 Grade III-IV 2x30 seconds each level    Seated repeated thoracic extension with towel roll x20  Theraball forward rolls in sitting for low back stretch x5 with 10 second holds.  Theraball rolls to L in sitting for low back stretch x5 with 10 second holds.  Theraball  rolls to R in sitting for low back stretch x5 with 10 second holds.  Supine double knee to chest x10 with 10 second holds  Standing marching in sanding with RTB x10 each LE with 5 second holds  [facility]  Paloff press at [employer] machine with 10# 2x10 with 3 second holds  Matrix Bil hip Abd 70# 2x10 each LE  Matrix Bil hip E 70# 2x10 each LE                        PT Education - 01/11/18 1705    Education provided  Yes    Education Details  Exercise technique    Person(s) Educated  Patient    Methods  Explanation;Demonstration;Verbal cues    Comprehension  Verbalized understanding;Returned demonstration;Need further instruction;Verbal cues required       PT Short Term Goals - 01/02/18 0943      PT SHORT TERM GOAL #1   Title  Pt will be independent with HEP for carryover between sessions    Baseline  2    Period  Weeks    Status  New        PT Long Term Goals - 01/02/18 0946      PT LONG TERM GOAL #1   Title  Pt will improve Bil glute max and med strength to at least 4+/5 for improved functional use of each LE and to support lower back    Baseline  4    Period  Weeks    Status  New      PT LONG TERM GOAL #2   Title  Pt will improve mODI to at least 16% to demonstrate decreased effect of back pain on functional activities    Baseline  26%    Time  4    Period  Weeks    Status  New      PT LONG TERM GOAL #3   Title  Pt will be able to perform daily activities (bending, sit>stand, reaching, donning pants) painfree for improved QOL    Baseline  painful    Time  3    Period  Weeks    Status  New      PT LONG TERM GOAL #4   Title  Pt will improve FABQpa to at least 6/24 to demonstrate decreased fear of pain with activities    Baseline  14/24    Time  4    Period  Weeks    Status  New            Plan - 01/11/18 1749    Clinical Impression Statement  Pt demonstrates poor stability in SLS especially on  LLE and decreased hip flexor  strength on LLE.  This was addressed with hip flexion theraband exercise to simulate activity of donning socks and shoes.  Introduced strengthening exercises to target glute med and max this session with pt verbalizing and demonstrating fatigue toward end of each set.  Pt encouraged pt continue with HEP as previously provided.  Pt will benefit from continued skilled PT interventions for improved strenth and decreased pain.     Rehab Potential  Good    PT Frequency  2x / week    PT Duration  4 weeks    PT Treatment/Interventions  ADLs/Self Care Home Management;Aquatic Therapy;Biofeedback;Cryotherapy;Electrical Stimulation;Iontophoresis 4mg /ml Dexamethasone;Moist Heat;Traction;Ultrasound;Contrast Bath;DME Instruction;Gait training;Stair training;Functional mobility training;Therapeutic exercise;Therapeutic activities;Balance training;Neuromuscular re-education;Patient/family education;Orthotic Fit/Training;Manual techniques;Passive range of motion;Dry needling;Energy conservation;Taping    PT Next Visit Plan  Progress glute strengthening, spinal joint mobilizations, stretching to low back, STM R lumbar paraspinals, strengthening lower trap and rhomboids    PT Home Exercise Plan  prayer stretch forward left and right, monster walks with band around ankles, scapular retraction in standing with GTB, low trap in prone    Consulted and Agree with Plan of Care  Patient       Patient will benefit from skilled therapeutic intervention in order to improve the following deficits and impairments:  Abnormal gait, Decreased balance, Decreased endurance, Decreased range of motion, Decreased strength, Difficulty walking, Hypomobility, Increased fascial restricitons, Increased muscle spasms, Impaired perceived functional ability, Improper body mechanics, Postural dysfunction, Pain  Visit Diagnosis: Chronic bilateral low back pain without sciatica  Muscle weakness  (generalized)     Problem List Patient Active Problem List   Diagnosis Date Noted  . Chronic left-sided low back pain without sciatica 12/08/2017  . Abnormal mammogram 12/08/2017  . Elevated hemoglobin (HCC) 06/20/2017  . Depression, major, single episode, moderate (HCC) 04/28/2017  . Abnormal uterine bleeding 03/01/2016    Encarnacion Chu PT, DPT 01/11/2018, 5:52 PM  [facility] PHYSICAL AND SPORTS MEDICINE [address], [place], [address] Phone: [phone]   Fax:  [phone]  Name: Misty Fernandez MRN: [id number] Date of Birth: 1971-06-02

## 2018-01-12 ENCOUNTER — Inpatient Hospital Stay: Payer: 59 | Attending: Oncology

## 2018-01-12 ENCOUNTER — Inpatient Hospital Stay: Payer: 59 | Admitting: Oncology

## 2018-01-17 ENCOUNTER — Encounter: Payer: Self-pay | Admitting: Physical Therapy

## 2018-01-17 ENCOUNTER — Ambulatory Visit: Payer: 59 | Admitting: Physical Therapy

## 2018-01-17 DIAGNOSIS — M545 Low back pain, unspecified: Secondary | ICD-10-CM

## 2018-01-17 DIAGNOSIS — M6281 Muscle weakness (generalized): Secondary | ICD-10-CM

## 2018-01-17 DIAGNOSIS — G8929 Other chronic pain: Secondary | ICD-10-CM

## 2018-01-17 NOTE — Therapy (Signed)
[facility]ORTS MEDICINE [address], [place], [address] Phone: [phone]   Fax:  [phone]  Physical Therapy Treatment  Patient Details  Name: Misty Fernandez MRN: [id number] Date of Birth: 05/30/1971 Referring Provider: Glori Luis, MD   Encounter Date: 01/17/2018  PT End of Session - 01/17/18 0755    Visit Number  4    Number of Visits  9    Date for PT Re-Evaluation  01/30/18    PT Start Time  0756    PT Stop Time  0845    PT Time Calculation (min)  49 min    Activity Tolerance  Patient tolerated treatment well    Behavior During Therapy  [facility] for tasks assessed/performed       Past Medical History:  Diagnosis Date  . Chickenpox   . Depression   . GERD (gastroesophageal reflux disease)   . Headache   . Heart murmur   . High blood pressure   . Preeclampsia     Past Surgical History:  Procedure Laterality Date  . No surgical history    . UTERINE SEPTUM RESECTION  2008    There were no vitals filed for this visit.  Subjective Assessment - 01/17/18 0759    Subjective  Pt reports her back is feeling much better and only had mild pain following trip to [place] which is typically when her back is the worst.  Pt was not able to complete her HEP as regularly while out of town.     Pertinent History  Pt presents with chronic LBP (at least 5 years) which is mainly on the L side but does span her entire lower back. Over past few years pt has had increasing levels of issues with back pain. When pt has had flare ups in the past they typically have always self resolved with time. Since last April pt has had a significant period where "it feels like almost every activity was painful". Since flare up last April her pain has gotten better some but not significantly better. Pt typically is very active exercising 3-4x/wk with her personal trainer but she has not exercised for the past month as her personal trainer is  currently relocating. Aggravating factors: bending over, SLS when donning pants, sneezing, sit>stand, reaching. Easing factors: ice, aleve (pt doesn't like doing this). Heat does not help. No MRI. X-ray on 12/08/17: mild scoliosis concave L with diffuse mild degenerative change. No acute bony abnormality identified. SI joints intact. Left nephrolithiasis cannot be excluded. Denies changes in bowel or bladder, night sweats, sudden changes in weight. Denies any symptoms down her legs. No numbness or tingling. 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 When pt has increase in pain from an activity then her pain goes back down to her baseline ache almost immediately after getting out of that position. No pain with sitting or standing. Every now and then walking is painful. Pt's hobbies include exercising, reading, knitting. Current pain: 3-4/10, best 2-3/10, worst 6-7/10. Pt has seen a chiropractor in the past with some success. Saw a chiropractor again recently and did not see any results.     How long can you sit comfortably?  unlimited    How long can you stand comfortably?  unlimited    How long can you walk comfortably?  unlimited  Diagnostic tests  X-ray on 12/08/17: mild scoliosis concave L with diffuse mild degenerative change. No acute bony abnormality identified. SI joints intact. Left nephrolithiasis cannot be excluded.     Patient Stated Goals  to be able to perform daily activities and work duties painfree.  To be able to complete gym routine without pain.    Currently in Pain?  No/denies    Multiple Pain Sites  No        TREATMENT   CPA L4-5, T6-12 Grade III-IV 3x30 seconds each level  Supine double knee to chest x10 with 10 second holds  Supine single knee to chest x10 with 10 second holds each LE  Hooklying posterior pelvic tilts with 10 second holds  x10  Hooklying marching while maintaining posterior pelvic tilt 2x10 each LE. Pt reports fatigue at end of each set (added to HEP).  STM lower lumbar paraspinals on L side, pt reports tenderness  Standing posterior pelvic tilts x20 at wall. Challenging for the pt.  TRX squats x15 with cues for greater hip hinge  Standing marching in sanding with GTB x10 each LE with 5 second holds. Cues to achieve posterior pelvic tilt prior to marching.  HS length assessment, 90-90: WNL BLE  Squats on bosu ball with cues for greater hip hinge x10  Seated prayer stretch on theraball forward, L, and R x10 each direction                       PT Education - 01/17/18 0755    Education provided  Yes    Education Details  Exercise technique    Person(s) Educated  Patient    Methods  Explanation;Demonstration;Verbal cues    Comprehension  Verbalized understanding;Returned demonstration;Verbal cues required;Need further instruction       PT Short Term Goals - 01/02/18 0943      PT SHORT TERM GOAL #1   Title  Pt will be independent with HEP for carryover between sessions    Baseline  2    Period  Weeks    Status  New        PT Long Term Goals - 01/02/18 0946      PT LONG TERM GOAL #1   Title  Pt will improve Bil glute max and med strength to at least 4+/5 for improved functional use of each LE and to support lower back    Baseline  4    Period  Weeks    Status  New      PT LONG TERM GOAL #2   Title  Pt will improve mODI to at least 16% to demonstrate decreased effect of back pain on functional activities    Baseline  26%    Time  4    Period  Weeks    Status  New      PT LONG TERM GOAL #3   Title  Pt will be able to perform daily activities (bending, sit>stand, reaching, donning pants) painfree for improved QOL    Baseline  painful    Time  3    Period  Weeks    Status  New      PT LONG TERM GOAL #4   Title  Pt will improve FABQpa to at least 6/24 to demonstrate  decreased fear of pain with activities    Baseline  14/24    Time  4    Period  Weeks    Status  New  Plan - 01/17/18 0801    Clinical Impression Statement  Pt presents with reported improvement in low back symptoms following trip out of the country.  This is typically when her back feels the worst, after traveling.  Pt with pain into L low back with CPAs to T11-12 which improved with several bouts.  Encouraged pt to focus on engaging core whenever she is changing positions as this is when she feels her back pain.  Introduced posterior pelvic tilts this session.  Pt will benefit from continued skilled PT interventions for decreased pain.     Rehab Potential  Good    PT Frequency  2x / week    PT Duration  4 weeks    PT Treatment/Interventions  ADLs/Self Care Home Management;Aquatic Therapy;Biofeedback;Cryotherapy;Electrical Stimulation;Iontophoresis 4mg /ml Dexamethasone;Moist Heat;Traction;Ultrasound;Contrast Bath;DME Instruction;Gait training;Stair training;Functional mobility training;Therapeutic exercise;Therapeutic activities;Balance training;Neuromuscular re-education;Patient/family education;Orthotic Fit/Training;Manual techniques;Passive range of motion;Dry needling;Energy conservation;Taping    PT Next Visit Plan  Progress glute strengthening, spinal joint mobilizations, stretching to low back, STM R lumbar paraspinals, strengthening lower trap and rhomboids    PT Home Exercise Plan  prayer stretch forward left and right, monster walks with band around ankles, scapular retraction in standing with GTB, low trap in prone, hooklying pevlic tilt marches    Consulted and Agree with Plan of Care  Patient       Patient will benefit from skilled therapeutic intervention in order to improve the following deficits and impairments:  Abnormal gait, Decreased balance, Decreased endurance, Decreased range of motion, Decreased strength, Difficulty walking, Hypomobility, Increased fascial  restricitons, Increased muscle spasms, Impaired perceived functional ability, Improper body mechanics, Postural dysfunction, Pain  Visit Diagnosis: Chronic bilateral low back pain without sciatica  Muscle weakness (generalized)     Problem List Patient Active Problem List   Diagnosis Date Noted  . Chronic left-sided low back pain without sciatica 12/08/2017  . Abnormal mammogram 12/08/2017  . Elevated hemoglobin (HCC) 06/20/2017  . Depression, major, single episode, moderate (HCC) 04/28/2017  . Abnormal uterine bleeding 03/01/2016    Encarnacion ChuAshley Darrion Wyszynski PT, DPT 01/17/2018, 8:51 AM  [facility] [address] [place], [place], [id number] Phone: [phone]   Fax:  [phone]  Name: Cori RazorDonna R Fernandez MRN: [id number] Date of Birth: Mar 21, 1971

## 2018-01-23 ENCOUNTER — Ambulatory Visit: Payer: 59

## 2018-01-25 ENCOUNTER — Ambulatory Visit: Payer: 59

## 2018-01-25 ENCOUNTER — Encounter: Payer: Self-pay | Admitting: Family Medicine

## 2018-01-25 DIAGNOSIS — M545 Low back pain: Secondary | ICD-10-CM | POA: Diagnosis not present

## 2018-01-25 DIAGNOSIS — G8929 Other chronic pain: Secondary | ICD-10-CM

## 2018-01-25 DIAGNOSIS — M6281 Muscle weakness (generalized): Secondary | ICD-10-CM

## 2018-01-25 NOTE — Therapy (Signed)
[facility] [address] [place], [address] Phone: [phone]   Fax:  [phone]  Physical Therapy Treatment  Patient Details  Name: Misty Fernandez MRN: [id number] Date of Birth: Aug 26, 1971 Referring Provider: Glori Luis, MD   Encounter Date: 01/25/2018  PT End of Session - 01/25/18 0815    Visit Number  5    Number of Visits  9    Date for PT Re-Evaluation  01/30/18    PT Start Time  0810    PT Stop Time  0855    PT Time Calculation (min)  45 min    Activity Tolerance  Patient tolerated treatment well    Behavior During Therapy  [facility] for tasks assessed/performed       Past Medical History:  Diagnosis Date  . Chickenpox   . Depression   . GERD (gastroesophageal reflux disease)   . Headache   . Heart murmur   . High blood pressure   . Preeclampsia     Past Surgical History:  Procedure Laterality Date  . No surgical history    . UTERINE SEPTUM RESECTION  2008    There were no vitals filed for this visit.  Subjective Assessment - 01/25/18 0810    Subjective  Pt reports she is doing well on this date. She denies any low back pain at this time. She is performing HEP without issue and is still working with her Systems analyst. No specific questions or concerns at this time.     Pertinent History  Pt presents with chronic LBP (at least 5 years) which is mainly on the L side but does span her entire lower back. Over past few years pt has had increasing levels of issues with back pain. When pt has had flare ups in the past they typically have always self resolved with time. Since last April pt has had a significant period where "it feels like almost every activity was painful". Since flare up last April her pain has gotten better some but not significantly better. Pt typically is very active exercising 3-4x/wk with her personal trainer but she has not exercised for the past month as her personal  trainer is currently relocating. Aggravating factors: bending over, SLS when donning pants, sneezing, sit>stand, reaching. Easing factors: ice, aleve (pt doesn't like doing this). Heat does not help. No MRI. X-ray on 12/08/17: mild scoliosis concave L with diffuse mild degenerative change. No acute bony abnormality identified. SI joints intact. Left nephrolithiasis cannot be excluded. Denies changes in bowel or bladder, night sweats, sudden changes in weight. Denies any symptoms down her legs. No numbness or tingling. 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 When pt has increase in pain from an activity then her pain goes back down to her baseline ache almost immediately after getting out of that position. No pain with sitting or standing. Every now and then walking is painful. Pt's hobbies include exercising, reading, knitting. Current pain: 3-4/10, best 2-3/10, worst 6-7/10. Pt has seen a chiropractor in the past with some success. Saw a chiropractor again recently and did not see any results.     How long can you sit comfortably?  unlimited    How long can you stand comfortably?  unlimited    How long can you walk comfortably?  unlimited  Diagnostic tests  X-ray on 12/08/17: mild scoliosis concave L with diffuse mild degenerative change. No acute bony abnormality identified. SI joints intact. Left nephrolithiasis cannot be excluded.     Patient Stated Goals  to be able to perform daily activities and work duties painfree.  To be able to complete gym routine without pain.    Currently in Pain?  No/denies        TREATMENT   Manual Therapy  CPA L4-5, T6-12 Grade III-IV 2 x 30 seconds each level;  STM lower lumbar paraspinals on L side, pt reports tenderness;  Ther-ex  Hooklying marching while maintaining posterior pelvic tilt x 15 each LE; Attempted bridges but  pt is very lumbar dominant so switched to single leg bridges; Single leg bridges with other leg up to chest, posterior pelvic tilt to minimize lumbar compensation x 10 bilateral, challenging for patient not to overactivate lumbar paraspinals and extend spine; Hooklying posterior pelvic tilts with 5 second holds x 10; Deadlifts with 20#, cues for improved hip hinge but otherwise good technique, no back pain x 10; TRX single leg squats x 10 bilateral, cues to minimize UE support as much as able, cues to avoid valgus, form deteriorates on RLE after approximately 6 reps so break provided; Squats on bosu ball x 10, good form; Step down assessed with notable valgus and instability bilaterally; Discussed exercises that can be included in workouts with her personal trainer;                        PT Education - 01/25/18 0815    Education provided  Yes    Education Details  exercise form/technique, gym based exercises she can add to her routine with personal trainer    Person(s) Educated  Patient    Methods  Explanation    Comprehension  Verbalized understanding       PT Short Term Goals - 01/02/18 0943      PT SHORT TERM GOAL #1   Title  Pt will be independent with HEP for carryover between sessions    Baseline  2    Period  Weeks    Status  New        PT Long Term Goals - 01/02/18 0946      PT LONG TERM GOAL #1   Title  Pt will improve Bil glute max and med strength to at least 4+/5 for improved functional use of each LE and to support lower back    Baseline  4    Period  Weeks    Status  New      PT LONG TERM GOAL #2   Title  Pt will improve mODI to at least 16% to demonstrate decreased effect of back pain on functional activities    Baseline  26%    Time  4    Period  Weeks    Status  New      PT LONG TERM GOAL #3   Title  Pt will be able to perform daily activities (bending, sit>stand, reaching, donning pants) painfree for improved QOL    Baseline   painful    Time  3    Period  Weeks    Status  New      PT LONG TERM GOAL #4   Title  Pt will improve FABQpa to at least 6/24 to demonstrate decreased fear of pain with activities    Baseline  14/24    Time  4    Period  Weeks    Status  New            Plan - 01/25/18 0816    Clinical Impression Statement  Pt denies any back pain during therapy today. She demonstrates poor sitting and standing thoracic posture with forward head and rounded shoulders. She struggles to avoid lumbar activation during hooklying bridges and requires considerable effort to maintain posterior pelvic tilts. Pt with notable hip abduction weakness with single leg squats and step downs. Education provided about exercises that can be added to her routine with her personal trainer. Pt encouraged to continue HEP and follow-up as scheduled.     Rehab Potential  Good    PT Frequency  2x / week    PT Duration  4 weeks    PT Treatment/Interventions  ADLs/Self Care Home Management;Aquatic Therapy;Biofeedback;Cryotherapy;Electrical Stimulation;Iontophoresis 4mg /ml Dexamethasone;Moist Heat;Traction;Ultrasound;Contrast Bath;DME Instruction;Gait training;Stair training;Functional mobility training;Therapeutic exercise;Therapeutic activities;Balance training;Neuromuscular re-education;Patient/family education;Orthotic Fit/Training;Manual techniques;Passive range of motion;Dry needling;Energy conservation;Taping    PT Next Visit Plan  Progress glute strengthening, spinal joint mobilizations, stretching to low back, STM R lumbar paraspinals, strengthening lower trap and rhomboids    PT Home Exercise Plan  prayer stretch forward left and right, monster walks with band around ankles, scapular retraction in standing with GTB, low trap in prone, hooklying pevlic tilt marches    Consulted and Agree with Plan of Care  Patient       Patient will benefit from skilled therapeutic intervention in order to improve the following deficits  and impairments:  Abnormal gait, Decreased balance, Decreased endurance, Decreased range of motion, Decreased strength, Difficulty walking, Hypomobility, Increased fascial restricitons, Increased muscle spasms, Impaired perceived functional ability, Improper body mechanics, Postural dysfunction, Pain  Visit Diagnosis: Chronic bilateral low back pain without sciatica  Muscle weakness (generalized)     Problem List Patient Active Problem List   Diagnosis Date Noted  . Chronic left-sided low back pain without sciatica 12/08/2017  . Abnormal mammogram 12/08/2017  . Elevated hemoglobin (HCC) 06/20/2017  . Depression, major, single episode, moderate (HCC) 04/28/2017  . Abnormal uterine bleeding 03/01/2016   Lynnea Maizes PT, DPT   Keerthana Vanrossum 01/25/2018, 1:48 PM  [facility] PH[facility] AND SPORTS MEDICINE [address], [place], [address] Phone: [phone]   Fax:  [phone]  Name: Misty Fernandez MRN: [id number] Date of Birth: July 06, 1971

## 2018-01-29 ENCOUNTER — Encounter: Payer: Self-pay | Admitting: Physical Therapy

## 2018-01-29 ENCOUNTER — Ambulatory Visit: Payer: 59 | Attending: Family Medicine | Admitting: Physical Therapy

## 2018-01-29 DIAGNOSIS — G8929 Other chronic pain: Secondary | ICD-10-CM | POA: Diagnosis present

## 2018-01-29 DIAGNOSIS — M545 Low back pain, unspecified: Secondary | ICD-10-CM

## 2018-01-29 DIAGNOSIS — M6281 Muscle weakness (generalized): Secondary | ICD-10-CM | POA: Insufficient documentation

## 2018-01-29 NOTE — Therapy (Signed)
[facility] [address] [place], [address] Phone: [phone]   Fax:  [phone]  Physical Therapy Treatment  Patient Details  Name: GEORGE ALCANTAR MRN: [id number] Date of Birth: [birth year] Referring Provider: Glori Luis, MD   Encounter Date: 01/29/2018  PT End of Session - 01/29/18 0757    Visit Number  6    Number of Visits  9    Date for PT Re-Evaluation  01/30/18    PT Start Time  0757    PT Stop Time  0839    PT Time Calculation (min)  42 min    Activity Tolerance  Patient tolerated treatment well    Behavior During Therapy  [facility] for tasks assessed/performed       Past Medical History:  Diagnosis Date  . Chickenpox   . Depression   . GERD (gastroesophageal reflux disease)   . Headache   . Heart murmur   . High blood pressure   . Preeclampsia     Past Surgical History:  Procedure Laterality Date  . No surgical history    . UTERINE SEPTUM RESECTION  2008    There were no vitals filed for this visit.  Subjective Assessment - 01/29/18 0800    Subjective  Pt reports her back has been tender and problematic all weekend.  Pain is in her L lower back. "It feels like something is in the wrong place".  Says that walking around grocery store was painful.  Pt tried some of her stretches and pelvic tilts but this was more painful.  Pain started on Friday in the afternoon.     Pertinent History  Pt presents with chronic LBP (at least 5 years) which is mainly on the L side but does span her entire lower back. Over past few years pt has had increasing levels of issues with back pain. When pt has had flare ups in the past they typically have always self resolved with time. Since last April pt has had a significant period where "it feels like almost every activity was painful". Since flare up last April her pain has gotten better some but not significantly better. Pt typically is very active exercising  3-4x/wk with her personal trainer but she has not exercised for the past month as her personal trainer is currently relocating. Aggravating factors: bending over, SLS when donning pants, sneezing, sit>stand, reaching. Easing factors: ice, aleve (pt doesn't like doing this). Heat does not help. No MRI. X-ray on 12/08/17: mild scoliosis concave L with diffuse mild degenerative change. No acute bony abnormality identified. SI joints intact. Left nephrolithiasis cannot be excluded. Denies changes in bowel or bladder, night sweats, sudden changes in weight. Denies any symptoms down her legs. No numbness or tingling. 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 When pt has increase in pain from an activity then her pain goes back down to her baseline ache almost immediately after getting out of that position. No pain with sitting or standing. Every now and then walking is painful. Pt's hobbies include exercising, reading, knitting. Current pain: 3-4/10, best 2-3/10, worst 6-7/10. Pt has seen a chiropractor in the past with some success. Saw a chiropractor again recently and did not see any results.     How long can you sit comfortably?  unlimited  How long can you stand comfortably?  unlimited    How long can you walk comfortably?  unlimited    Diagnostic tests  X-ray on 12/08/17: mild scoliosis concave L with diffuse mild degenerative change. No acute bony abnormality identified. SI joints intact. Left nephrolithiasis cannot be excluded.     Patient Stated Goals  to be able to perform daily activities and work duties painfree.  To be able to complete gym routine without pain.    Currently in Pain?  Yes    Pain Score  6     Pain Location  Back    Pain Orientation  Left;Lower    Pain Descriptors / Indicators  Aching        TREATMENT   CPA L4-5, T6-12 Grade III-IV 3x30  seconds each level    Increased tension noted Bil paraspinals but not TTP   Hooklying posterior pelvic tilts with 10 second holds x10   Hooklying posterior pelvic tilt hold with marching 2x10 each LE   Supine single knee to chest x10 with 10 second holds each LE   Supine double knee to chest x10 with 10 second holds   Seated pelvic tilt with 10 second holds x10   Standing pelvic tilt on wall x10 with 10 second holds. Very challenging for the pt but improved since initial try a few sessions ago. Pt initially initiating movement from lower back.   Standing trunk rotation with GTB with cues to keep posterior pelvic tilt and maintain abdominal activation throughout. 2x10 each direction.   Quadruped rocking back onto heels for hip/low back dissociation. Demonstration and cues for proper technique to avoid lumbar lordosis. x20   Standing marching with GTB x10 each LE with 5 second holds. Cues to achieve posterior pelvic tilt prior to marching.                         PT Education - 01/29/18 0757    Education provided  Yes    Education Details  Exercise technique    Person(s) Educated  Patient    Methods  Explanation;Demonstration;Verbal cues    Comprehension  Verbalized understanding;Returned demonstration;Verbal cues required;Need further instruction       PT Short Term Goals - 01/02/18 0943      PT SHORT TERM GOAL #1   Title  Pt will be independent with HEP for carryover between sessions    Baseline  2    Period  Weeks    Status  New        PT Long Term Goals - 01/02/18 0946      PT LONG TERM GOAL #1   Title  Pt will improve Bil glute max and med strength to at least 4+/5 for improved functional use of each LE and to support lower back    Baseline  4    Period  Weeks    Status  New      PT LONG TERM GOAL #2   Title  Pt will improve mODI to at least 16% to demonstrate decreased effect of back pain on functional activities    Baseline  26%    Time   4    Period  Weeks    Status  New      PT LONG TERM GOAL #3   Title  Pt will be able to perform daily activities (bending, sit>stand, reaching, donning pants) painfree for improved QOL    Baseline  painful  Time  3    Period  Weeks    Status  New      PT LONG TERM GOAL #4   Title  Pt will improve FABQpa to at least 6/24 to demonstrate decreased fear of pain with activities    Baseline  14/24    Time  4    Period  Weeks    Status  New            Plan - 01/29/18 0803    Clinical Impression Statement  Pt likely with insult to lower back sometime Friday without realizing it which has resulted in tightness/stiffness and pain.  Pt demonstrates hypomobility and guarding specifically in lumbar spine which is likely intensifying low back pain.  Pt tolerated CPAs to this region and thoracic region well.  Had pt perform only painfree to mildly painful exercises at most.  Focused on progression of posterior pelvic tilt as this remains challenging for the pt, especially in standing.  Pt to progress HEP to sitting posterior pelvic tilts.  Introduced low back/hip dissociation exercise to decrease low back from taking on inappropriate loads.  Pt will benefit from continued skilled PT interventions for decreased pain and flare ups.     Rehab Potential  Good    PT Frequency  2x / week    PT Duration  4 weeks    PT Treatment/Interventions  ADLs/Self Care Home Management;Aquatic Therapy;Biofeedback;Cryotherapy;Electrical Stimulation;Iontophoresis 4mg /ml Dexamethasone;Moist Heat;Traction;Ultrasound;Contrast Bath;DME Instruction;Gait training;Stair training;Functional mobility training;Therapeutic exercise;Therapeutic activities;Balance training;Neuromuscular re-education;Patient/family education;Orthotic Fit/Training;Manual techniques;Passive range of motion;Dry needling;Energy conservation;Taping    PT Next Visit Plan  Progress glute strengthening, spinal joint mobilizations, stretching to low back,  STM R lumbar paraspinals, strengthening lower trap and rhomboids    PT Home Exercise Plan  prayer stretch forward left and right, monster walks with band around ankles, scapular retraction in standing with GTB, low trap in prone, hooklying pevlic tilt marches    Consulted and Agree with Plan of Care  Patient       Patient will benefit from skilled therapeutic intervention in order to improve the following deficits and impairments:  Abnormal gait, Decreased balance, Decreased endurance, Decreased range of motion, Decreased strength, Difficulty walking, Hypomobility, Increased fascial restricitons, Increased muscle spasms, Impaired perceived functional ability, Improper body mechanics, Postural dysfunction, Pain  Visit Diagnosis: Chronic bilateral low back pain without sciatica  Muscle weakness (generalized)     Problem List Patient Active Problem List   Diagnosis Date Noted  . Chronic left-sided low back pain without sciatica 12/08/2017  . Abnormal mammogram 12/08/2017  . Elevated hemoglobin (HCC) 06/20/2017  . Depression, major, single episode, moderate (HCC) 04/28/2017  . Abnormal uterine bleeding 03/01/2016    Encarnacion ChuAshley Abashian PT, DPT 01/29/2018, 8:40 AM   [facility] [address] [place], [place], [id number] Phone: [phone]   Fax:  [phone]  Name: Cori RazorDonna R Needs MRN: [id number] Date of Birth: Dec 22, 1971

## 2018-02-01 ENCOUNTER — Other Ambulatory Visit: Payer: Self-pay

## 2018-02-01 ENCOUNTER — Ambulatory Visit: Payer: 59 | Admitting: Physical Therapy

## 2018-02-01 ENCOUNTER — Encounter: Payer: Self-pay | Admitting: Physical Therapy

## 2018-02-01 DIAGNOSIS — M545 Low back pain: Principal | ICD-10-CM

## 2018-02-01 DIAGNOSIS — G8929 Other chronic pain: Secondary | ICD-10-CM

## 2018-02-01 DIAGNOSIS — M6281 Muscle weakness (generalized): Secondary | ICD-10-CM

## 2018-02-01 MED ORDER — BUPROPION HCL ER (XL) 300 MG PO TB24: 300.0000 mg | ORAL_TABLET | Freq: Every day | ORAL | 0 refills | Status: DC

## 2018-02-01 NOTE — Therapy (Signed)
[facility]IC[facility] [address] [place], [place], [address] Phone: [phone]   Fax:  [phone]  Physical Therapy Treatment  Patient Details  Name: Misty RazorDonna R Schar MRN: [id number] Date of Birth: 07/21/1971 Referring Provider: Glori LuisEric G Sonnenberg, MD   Encounter Date: 02/01/2018  PT End of Session - 02/01/18 0813    Visit Number  7    Number of Visits  9    Date for PT Re-Evaluation  01/30/18    PT Start Time  0814    PT Stop Time  0858    PT Time Calculation (min)  44 min    Activity Tolerance  Patient tolerated treatment well    Behavior During Therapy  [facility]       Past Medical History:  Diagnosis Date  . Chickenpox   . Depression   . GERD (gastroesophageal reflux disease)   . Headache   . Heart murmur   . High blood pressure   . Preeclampsia     Past Surgical History:  Procedure Laterality Date  . No surgical history    . UTERINE SEPTUM RESECTION  2008    There were no vitals filed for this visit.  Subjective Assessment - 02/01/18 0817    Subjective  Pt reports her back is feeling better.  It is tender but not painful.  Pt has been doing her pelvic tilts and most of her other HEP exercises.      Pertinent History  Pt presents with chronic LBP (at least 5 years) which is mainly on the L side but does span her entire lower back. Over past few years pt has had increasing levels of issues with back pain. When pt has had flare ups in the past they typically have always self resolved with time. Since last April pt has had a significant period where "it feels like almost every activity was painful". Since flare up last April her pain has gotten better some but not significantly better. Pt typically is very active exercising 3-4x/wk with her personal trainer but she has not exercised for the past month as her personal trainer is currently relocating. Aggravating factors: bending over, SLS  when donning pants, sneezing, sit>stand, reaching. Easing factors: ice, aleve (pt doesn't like doing this). Heat does not help. No MRI. X-ray on 12/08/17: mild scoliosis concave L with diffuse mild degenerative change. No acute bony abnormality identified. SI joints intact. Left nephrolithiasis cannot be excluded. Denies changes in bowel or bladder, night sweats, sudden changes in weight. Denies any symptoms down her legs. No numbness or tingling. 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 When pt has increase in pain from an activity then her pain goes back down to her baseline ache almost immediately after getting out of that position. No pain with sitting or standing. Every now and then walking is painful. Pt's hobbies include exercising, reading, knitting. Current pain: 3-4/10, best 2-3/10, worst 6-7/10. Pt has seen a chiropractor in the past with some success. Saw a chiropractor again recently and did not see any results.     How long can you sit comfortably?  unlimited    How long can you stand comfortably?  unlimited    How long can you walk comfortably?  unlimited    Diagnostic tests  X-ray on 12/08/17: mild scoliosis  concave L with diffuse mild degenerative change. No acute bony abnormality identified. SI joints intact. Left nephrolithiasis cannot be excluded.     Patient Stated Goals  to be able to perform daily activities and work duties painfree.  To be able to complete gym routine without pain.    Currently in Pain?  No/denies    Multiple Pain Sites  No        TREATMENT   Hooklying posterior pelvic tilts with 10 second holds x10   Hooklying posterior pelvic tilt hold with marching 2x10 each LE   Hooklying posterior pelvic tilt hold with alternating kicking LE straight 2x10 each LE   Hooklying posterior pelvic tilt hold with kicking BLE straight 2x10.  Very challenging for the pt. Pt unable to bring LEs close to the mat table.   Seated pelvic tilt with support of back of chair with 10 second holds x10   Seated pelvic tilt without support of back of chair with 10 second holds x10   Standing pelvic tilt on wall 2x25. Challenging for the pt but improved since last session.   Quadruped rocking back onto heels for hip/low back dissociation. Demonstration and cues for proper technique to avoid lumbar lordosis. x20   Squats with TRX with cues for greater hip hinge 2x15   Total gym level 26 with pelvic tilt and TA activation 2x15 BLE squats   Prayer stretch on theraball forward, L, R x10 each direction with 10 second holds                       PT Education - 02/01/18 0813    Education provided  Yes    Education Details  Exercise technique    Person(s) Educated  Patient    Methods  Explanation;Demonstration;Verbal cues    Comprehension  Verbalized understanding;Returned demonstration;Verbal cues required;Need further instruction       PT Short Term Goals - 01/02/18 0943      PT SHORT TERM GOAL #1   Title  Pt will be independent with HEP for carryover between sessions    Baseline  2    Period  Weeks    Status  New        PT Long Term Goals - 01/02/18 0946      PT LONG TERM GOAL #1   Title  Pt will improve Bil glute max and med strength to at least 4+/5 for improved functional use of each LE and to support lower back    Baseline  4    Period  Weeks    Status  New      PT LONG TERM GOAL #2   Title  Pt will improve mODI to at least 16% to demonstrate decreased effect of back pain on functional activities    Baseline  26%    Time  4    Period  Weeks    Status  New      PT LONG TERM GOAL #3   Title  Pt will be able to perform daily activities (bending, sit>stand, reaching, donning pants) painfree for improved QOL    Baseline  painful    Time  3    Period  Weeks    Status  New      PT LONG TERM GOAL  #4   Title  Pt will improve FABQpa to at least 6/24 to demonstrate decreased fear of pain with activities    Baseline  14/24    Time  4    Period  Weeks    Status  New            Plan - 02/01/18 1610    Clinical Impression Statement  Instructed pt through progression of TA activation with pelvic tilts starting in hooklying and transitioning all the way to standing against wall.  Pt has most difficulty with hooklying BLE extending and standing pelvic tilt but is making progress with ability to progress with activation.  Pt will benefit from continued skilled PT interventions for improved abdominal control and decreased pain.     Rehab Potential  Good    PT Frequency  2x / week    PT Duration  4 weeks    PT Treatment/Interventions  ADLs/Self Care Home Management;Aquatic Therapy;Biofeedback;Cryotherapy;Electrical Stimulation;Iontophoresis 4mg /ml Dexamethasone;Moist Heat;Traction;Ultrasound;Contrast Bath;DME Instruction;Gait training;Stair training;Functional mobility training;Therapeutic exercise;Therapeutic activities;Balance training;Neuromuscular re-education;Patient/family education;Orthotic Fit/Training;Manual techniques;Passive range of motion;Dry needling;Energy conservation;Taping    PT Next Visit Plan  Progress glute strengthening, spinal joint mobilizations, stretching to low back, STM R lumbar paraspinals, strengthening lower trap and rhomboids    PT Home Exercise Plan  prayer stretch forward left and right, monster walks with band around ankles, scapular retraction in standing with GTB, low trap in prone, hooklying pevlic tilt marches    Consulted and Agree with Plan of Care  Patient       Patient will benefit from skilled therapeutic intervention in order to improve the following deficits and impairments:  Abnormal gait, Decreased balance, Decreased endurance, Decreased range of motion, Decreased strength, Difficulty walking, Hypomobility, Increased fascial restricitons, Increased  muscle spasms, Impaired perceived functional ability, Improper body mechanics, Postural dysfunction, Pain  Visit Diagnosis: Chronic bilateral low back pain without sciatica  Muscle weakness (generalized)     Problem List Patient Active Problem List   Diagnosis Date Noted  . Chronic left-sided low back pain without sciatica 12/08/2017  . Abnormal mammogram 12/08/2017  . Elevated hemoglobin (HCC) 06/20/2017  . Depression, major, single episode, moderate (HCC) 04/28/2017  . Abnormal uterine bleeding 03/01/2016    Encarnacion Chu PT, DPT 02/01/2018, 8:55 AM  [facility] PH[facility] AND SPORTS MEDICINE [address], [place], [address] Phone: [phone]   Fax:  [phone]  Name: ALLYSSIA SKLUZACEK MRN: [id number] Date of Birth: 05-07-1971

## 2018-02-05 ENCOUNTER — Ambulatory Visit: Payer: 59

## 2018-02-05 DIAGNOSIS — M545 Low back pain, unspecified: Secondary | ICD-10-CM

## 2018-02-05 DIAGNOSIS — G8929 Other chronic pain: Secondary | ICD-10-CM

## 2018-02-05 DIAGNOSIS — M6281 Muscle weakness (generalized): Secondary | ICD-10-CM

## 2018-02-05 NOTE — Therapy (Signed)
[place] AND SPORTS MEDICINE [address], [place], [address] Phone: [phone]   Fax:  [phone]  Physical Therapy Treatment  Patient Details  Name: Misty Fernandez MRN: [id number] Date of Birth: 01-05-1971 Referring Provider: Leone Haven, MD   Encounter Date: 02/05/2018  PT End of Session - 02/05/18 0804    Visit Number  8    Number of Visits  17    Date for PT Re-Evaluation  03/08/18    PT Start Time  0805    PT Stop Time  3295    PT Time Calculation (min)  52 min    Activity Tolerance  Patient tolerated treatment well    Behavior During Therapy  [facility] for tasks assessed/performed       Past Medical History:  Diagnosis Date  . Chickenpox   . Depression   . GERD (gastroesophageal reflux disease)   . Headache   . Heart murmur   . High blood pressure   . Preeclampsia     Past Surgical History:  Procedure Laterality Date  . No surgical history    . UTERINE SEPTUM RESECTION  2008    There were no vitals filed for this visit.  Subjective Assessment - 02/05/18 0806    Subjective  Back is doing ok. Much better than it was last Monday. No pain currently. 4.5/10 at most for the past 7 days. The daily activities are ok (sit to stand, reaching, donning and doffing pants), better than they have been.  Feels like she is getting better.      Pertinent History  Pt presents with chronic LBP (at least 5 years) which is mainly on the L side but does span her entire lower back. Over past few years pt has had increasing levels of issues with back pain. When pt has had flare ups in the past they typically have always self resolved with time. Since last April pt has had a significant period where "it feels like almost every activity was painful". Since flare up last April her pain has gotten better some but not significantly better. Pt typically is very active exercising 3-4x/wk with her personal trainer but she has not  exercised for the past month as her personal trainer is currently relocating. Aggravating factors: bending over, SLS when donning pants, sneezing, sit>stand, reaching. Easing factors: ice, aleve (pt doesn't like doing this). Heat does not help. No MRI. X-ray on 12/08/17: mild scoliosis concave L with diffuse mild degenerative change. No acute bony abnormality identified. SI joints intact. Left nephrolithiasis cannot be excluded. Denies changes in bowel or bladder, night sweats, sudden changes in weight. Denies any symptoms down her legs. No numbness or tingling. 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 When pt has increase in pain from an activity then her pain goes back down to her baseline ache almost immediately after getting out of that position. No pain with sitting or standing. Every now and then walking is painful. Pt's hobbies include exercising, reading, knitting. Current pain: 3-4/10, best 2-3/10, worst 6-7/10. Pt has seen a chiropractor in the past with some success. Saw a chiropractor again recently and did not see any results.     How long can you sit comfortably?  unlimited    How long can you stand comfortably?  unlimited    How  long can you walk comfortably?  unlimited    Diagnostic tests  X-ray on 12/08/17: mild scoliosis concave L with diffuse mild degenerative change. No acute bony abnormality identified. SI joints intact. Left nephrolithiasis cannot be excluded.     Patient Stated Goals  to be able to perform daily activities and work duties painfree.  To be able to complete gym routine without pain.    Currently in Pain?  No/denies    Pain Score  0-No pain         [facility] PT Assessment - 02/05/18 0810      Observation/Other Assessments   Modified [place]  20%    Fear Avoidance Belief Questionnaire (FABQ)   21/96 (13/30)      Strength   Right  Hip Extension  4+/5    Right Hip ABduction  4+/5    Left Hip Extension  4+/5    Left Hip ABduction  5/5                           PT Education - 02/05/18 0813    Education provided  Yes    Education Details  ther-ex, Plan of care    Person(s) Educated  Patient    Methods  Explanation;Demonstration;Tactile cues;Verbal cues    Comprehension  Verbalized understanding;Returned demonstration       Objectives  Ther-ex  Reviewed plan of care: 2x/week for 4 weeks  Manually resisted prone glute max extension, S/L hip abduction 1x each way for each LE.     Reviewed progress/current status with hip strength with pt  Seated pelvic tilt without support of back of chair with 10 second holds x10   Hooklying posterior pelvic tilts with 10 second holds x10   Hooklying posterior pelvic tilt hold with marching 2x10 each LE   Hooklying posterior pelvic tilt with alternating leg extension 10x2 each LE  Hooklying posterior pelvic tilt hold with kicking BLE straight 2x10.   Standing pelvic tilt on wall 2x20 with 3 second holds.  standing bilateral shoulder low rows resisting red band 10x5 seconds. R lumbar paraspinal tension palpated  Standing R shoulder extension red band 10x2. Slight decrease in R lumbar paraspinal muscle tension  Total gym level 26 with pelvic tilt and TA activation 2x15 BLE squats   Prayer stretch on theraball forward, L, R x10 each direction with 10 second holds  Ergonominc liftting 5x with light bolster    Improved hip and knee flexion, keeping load close and not twisting to turn after practice.   Continue working on [employer].   Improved exercise technique, movement at target joints, use of target muscles after min to mod verbal, visual, tactile cues.     Pt demonstrates difficulty with L pelvic control during exercises but improves with cues and repetition. Overall improving with decreasing pain and improving function per pt  reports. Pt also demonstrates significant improvement wih bilateral hip strength since initial evaluation. Pt still demonstrates back pain and difficutly performing functional tasks and would benefit from continued skilled pysical therapy services to address the aforementioned deficits.            PT Short Term Goals - 02/05/18 0827      PT SHORT TERM GOAL #1   Title  Pt will be independent with HEP for carryover between sessions    Time  4    Period  Weeks    Status  On-going    Target Date  03/08/18  PT Long Term Goals - 02/05/18 0981      PT LONG TERM GOAL #1   Title  Pt will improve Bil glute max and med strength to at least 4+/5 for improved functional use of each LE and to support lower back    Baseline  at least 4+/5 bilaterally (02/05/2018)    Time  4    Period  Weeks    Status  Achieved      PT LONG TERM GOAL #2   Title  Pt will improve mODI to at least 16% to demonstrate decreased effect of back pain on functional activities    Baseline  26%, 20% (02/05/2018)    Time  4    Period  Weeks    Status  On-going    Target Date  03/08/18      PT LONG TERM GOAL #3   Title  Pt will be able to perform daily activities (bending, sit>stand, reaching, donning pants) painfree for improved QOL    Baseline  painful; Still has pain but better per pt reports (02/05/2018)    Time  4    Period  Weeks    Status  Partially Met    Target Date  03/08/18      PT LONG TERM GOAL #4   Title  Pt will improve FABQpa to at least 6/24 to demonstrate decreased fear of pain with activities    Baseline  14/24; 13/30 (02/05/2018)    Time  4    Period  Weeks    Status  On-going    Target Date  03/08/18            Plan - 02/05/18 0814    Clinical Impression Statement  Pt demonstrates difficulty with L pelvic control during exercises but improves with cues and repetition. Overall improving with decreasing pain and improving function per pt reports. Pt also demonstrates  significant improvement wih bilateral hip strength since initial evaluation. Pt still demonstrates back pain and difficutly performing functional tasks and would benefit from continued skilled pysical therapy services to address the aforementioned deficits.     Rehab Potential  Good    PT Frequency  2x / week    PT Duration  4 weeks    PT Treatment/Interventions  ADLs/Self Care Home Management;Aquatic Therapy;Biofeedback;Cryotherapy;Electrical Stimulation;Iontophoresis 61m/ml Dexamethasone;Moist Heat;Traction;Ultrasound;Contrast Bath;DME Instruction;Gait training;Stair training;Functional mobility training;Therapeutic exercise;Therapeutic activities;Balance training;Neuromuscular re-education;Patient/family education;Orthotic Fit/Training;Manual techniques;Passive range of motion;Dry needling;Energy conservation;Taping    PT Next Visit Plan  Progress glute strengthening, spinal joint mobilizations, stretching to low back, STM R lumbar paraspinals, strengthening lower trap and rhomboids    PT Home Exercise Plan  prayer stretch forward left and right, monster walks with band around ankles, scapular retraction in standing with GTB, low trap in prone, hooklying pevlic tilt marches    Consulted and Agree with Plan of Care  Patient       Patient will benefit from skilled therapeutic intervention in order to improve the following deficits and impairments:  Abnormal gait, Decreased balance, Decreased endurance, Decreased range of motion, Decreased strength, Difficulty walking, Hypomobility, Increased fascial restricitons, Increased muscle spasms, Impaired perceived functional ability, Improper body mechanics, Postural dysfunction, Pain  Visit Diagnosis: Chronic bilateral low back pain without sciatica - Plan: PT plan of care cert/re-cert  Muscle weakness (generalized) - Plan: PT plan of care cert/re-cert     Problem List Patient Active Problem List   Diagnosis Date Noted  . Chronic left-sided low  back pain without sciatica 12/08/2017  .  Abnormal mammogram 12/08/2017  . Elevated hemoglobin ([place]) 06/20/2017  . Depression, major, single episode, moderate ([place]) 04/28/2017  . Abnormal uterine bleeding 03/01/2016    Joneen Boers PT, DPT  02/05/2018, 5:17 PM  [facility] PHYS[facility] AND SPORTS MEDICINE [address] [place], [address] Phone: [phone]   Fax:  [phone]  Name: LAURYL SEYER MRN: [id number] Date of Birth: 06-Dec-1971

## 2018-02-07 ENCOUNTER — Ambulatory Visit: Payer: 59

## 2018-02-07 DIAGNOSIS — M545 Low back pain, unspecified: Secondary | ICD-10-CM

## 2018-02-07 DIAGNOSIS — M6281 Muscle weakness (generalized): Secondary | ICD-10-CM

## 2018-02-07 DIAGNOSIS — G8929 Other chronic pain: Secondary | ICD-10-CM

## 2018-02-07 NOTE — Therapy (Signed)
[facility] [address] [place], [address] Phone: [phone]   Fax:  [phone]  Physical Therapy Treatment  Patient Details  Name: Misty Fernandez MRN: [id number] Date of Birth: [birth year] Referring Provider: Leone Haven, MD   Encounter Date: 02/07/2018  PT End of Session - 02/07/18 0805    Visit Number  9    Number of Visits  17    Date for PT Re-Evaluation  03/08/18    PT Start Time  0805    PT Stop Time  0858    PT Time Calculation (min)  53 min    Activity Tolerance  Patient tolerated treatment well    Behavior During Therapy  [facility] for tasks assessed/performed       Past Medical History:  Diagnosis Date  . Chickenpox   . Depression   . GERD (gastroesophageal reflux disease)   . Headache   . Heart murmur   . High blood pressure   . Preeclampsia     Past Surgical History:  Procedure Laterality Date  . No surgical history    . UTERINE SEPTUM RESECTION  2008    There were no vitals filed for this visit.  Subjective Assessment - 02/07/18 0806    Subjective  Back is not doing good today. 7/10 currently, low back area. Movement aggravates it such as getting into and out of the car and walking up and down the steps.  Was fine yesterday. Bothered her this morning as soon as she got out of the L side of the bed.  Pt usually sleeps on her L side and woke up on her L side this morning.  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  at work.  27 inches wide and 13.5 inches deep    Pertinent History  Pt presents with chronic LBP (at least 5 years) which is mainly on the L side but does span her entire lower back. Over past few years pt has had increasing levels of issues with back pain. When pt has had flare ups in the past they typically have always self resolved with time. Since last April pt has had a significant period where "it feels like almost every activity  was painful". Since flare up last April her pain has gotten better some but not significantly better. Pt typically is very active exercising 3-4x/wk with her personal trainer but she has not exercised for the past month as her personal trainer is currently relocating. Aggravating factors: bending over, SLS when donning pants, sneezing, sit>stand, reaching. Easing factors: ice, aleve (pt doesn't like doing this). Heat does not help. No MRI. X-ray on 12/08/17: mild scoliosis concave L with diffuse mild degenerative change. No acute bony abnormality identified. SI joints intact. Left nephrolithiasis cannot be excluded. Denies changes in bowel or bladder, night sweats, sudden changes in weight. Denies any symptoms down her legs. No numbness or tingling. 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 When pt has increase in pain from an activity then her pain goes back down to her baseline ache almost immediately after getting out of that position. No pain with sitting or standing. Every now and then walking is painful. Pt's hobbies include exercising, reading, knitting. Current pain: 3-4/10, best 2-3/10, worst 6-7/10. Pt has  seen a chiropractor in the past with some success. Saw a chiropractor again recently and did not see any results.     How long can you sit comfortably?  unlimited    How long can you stand comfortably?  unlimited    How long can you walk comfortably?  unlimited    Diagnostic tests  X-ray on 12/08/17: mild scoliosis concave L with diffuse mild degenerative change. No acute bony abnormality identified. SI joints intact. Left nephrolithiasis cannot be excluded.     Patient Stated Goals  to be able to perform daily activities and work duties painfree.  To be able to complete gym routine without pain.    Currently in Pain?  Yes    Pain Score  7                                PT Education - 02/07/18 1109    Education provided  Yes    Education Details  ther-ex    [employer]) Educated  Patient    Methods  Explanation;Demonstration;Tactile cues;Verbal cues    Comprehension  Returned demonstration;Verbalized understanding        Objectives  Low back pain horizontal band area around L4/5/S1.   Ther-ex  Pt able to perform log roll technique properly.   Standing back extension 10x. No change in back pain  Double knee to chest 1x. Increased discomfort.   Supine posterior pelvic tilts 10x10 seconds     Then with bilateral shoulder extension isometrics 10x10 seconds for 2 sets. Discomfort with the return motion from posterior pelvic tilt.  hooklying bilateral shoulder flexion 10x2 with 5 second holds to promote thoracic extension  Supine open books to promote thoracic extension 10x5 seconds  hooklying pelvic tilts with march 10x2 each LE  Seated R shoulder extension isometrics 10x2 with 5 seconds, hand on R thigh to help decrease R lumbar paraspinal tension. No change afterwards per pt.        Improved exercise technique, movement at target joints, use of target muscles after min to mod verbal, visual, tactile cues.    Manual therapy  Prone R UPA to L 5 and L4 TP grade 1-2, then 3- to 3    Decreased L low back pain  Prone STM R lumbar paraspinal muscles   Pt arrived to clinic today with increased back pain starting this morning after getting out of bed. Per subjective reports, back was fine yesterday. Slight decrease in low back pain per pt reports following manual therapy to lumbar spine. Continued working on thoracic extension as well as core muscle use through pelvic tilting since this seems to have a positive effect based on previous sessions. Patient will benefit from continued skilled physical therapy services to help decrease back pain and improve quality of life.        PT Short Term  Goals - 02/05/18 0827      PT SHORT TERM GOAL #1   Title  Pt will be independent with HEP for carryover between sessions    Time  4    Period  Weeks    Status  On-going    Target Date  03/08/18        PT Long Term Goals - 02/05/18 4970      PT LONG TERM GOAL #1   Title  Pt will improve Bil glute max and med strength to at least 4+/5 for improved functional use of each  LE and to support lower back    Baseline  at least 4+/5 bilaterally (02/05/2018)    Time  4    Period  Weeks    Status  Achieved      PT LONG TERM GOAL #2   Title  Pt will improve mODI to at least 16% to demonstrate decreased effect of back pain on functional activities    Baseline  26%, 20% (02/05/2018)    Time  4    Period  Weeks    Status  On-going    Target Date  03/08/18      PT LONG TERM GOAL #3   Title  Pt will be able to perform daily activities (bending, sit>stand, reaching, donning pants) painfree for improved QOL    Baseline  painful; Still has pain but better per pt reports (02/05/2018)    Time  4    Period  Weeks    Status  Partially Met    Target Date  03/08/18      PT LONG TERM GOAL #4   Title  Pt will improve FABQpa to at least 6/24 to demonstrate decreased fear of pain with activities    Baseline  14/24; 13/30 (02/05/2018)    Time  4    Period  Weeks    Status  On-going    Target Date  03/08/18            Plan - 02/07/18 1110    Clinical Impression Statement  Pt arrived to clinic today with increased back pain starting this morning after getting out of bed. Per subjective reports, back was fine yesterday. Slight decrease in low back pain per pt reports following manual therapy to lumbar spine. Continued working on thoracic extension as well as core muscle use through pelvic tilting since this seems to have a positive effect based on previous sessions. Patient will benefit from continued skilled physical therapy services to help decrease back pain and improve quality of life.     Rehab  Potential  Good    PT Frequency  2x / week    PT Duration  4 weeks    PT Treatment/Interventions  ADLs/Self Care Home Management;Aquatic Therapy;Biofeedback;Cryotherapy;Electrical Stimulation;Iontophoresis 39m/ml Dexamethasone;Moist Heat;Traction;Ultrasound;Contrast Bath;DME Instruction;Gait training;Stair training;Functional mobility training;Therapeutic exercise;Therapeutic activities;Balance training;Neuromuscular re-education;Patient/family education;Orthotic Fit/Training;Manual techniques;Passive range of motion;Dry needling;Energy conservation;Taping    PT Next Visit Plan  Progress glute strengthening, spinal joint mobilizations, stretching to low back, STM R lumbar paraspinals, strengthening lower trap and rhomboids    PT Home Exercise Plan  prayer stretch forward left and right, monster walks with band around ankles, scapular retraction in standing with GTB, low trap in prone, hooklying pevlic tilt marches    Consulted and Agree with Plan of Care  Patient       Patient will benefit from skilled therapeutic intervention in order to improve the following deficits and impairments:  Abnormal gait, Decreased balance, Decreased endurance, Decreased range of motion, Decreased strength, Difficulty walking, Hypomobility, Increased fascial restricitons, Increased muscle spasms, Impaired perceived functional ability, Improper body mechanics, Postural dysfunction, Pain  Visit Diagnosis: Chronic bilateral low back pain without sciatica  Muscle weakness (generalized)     Problem List Patient Active Problem List   Diagnosis Date Noted  . Chronic left-sided low back pain without sciatica 12/08/2017  . Abnormal mammogram 12/08/2017  . Elevated hemoglobin ([facility] 06/20/2017  . Depression, major, single episode, moderate ([facility] 04/28/2017  . Abnormal uterine bleeding 03/01/2016    MJoneen BoersPT, DPT  02/07/2018, 11:21 AM  [facility] [address], [place], [address] Phone: [phone]   Fax:  [phone]  Name: Misty Fernandez MRN: [id number] Date of Birth: 09/24/1971

## 2018-02-12 ENCOUNTER — Ambulatory Visit: Payer: 59

## 2018-02-14 ENCOUNTER — Ambulatory Visit: Payer: 59

## 2018-02-14 DIAGNOSIS — M545 Low back pain: Principal | ICD-10-CM

## 2018-02-14 DIAGNOSIS — G8929 Other chronic pain: Secondary | ICD-10-CM

## 2018-02-14 DIAGNOSIS — M6281 Muscle weakness (generalized): Secondary | ICD-10-CM

## 2018-02-14 NOTE — Therapy (Signed)
[facility] [address] [place], [address] Phone: [phone]   Fax:  [phone]  Physical Therapy Treatment  Patient Details  Name: Misty Fernandez MRN: [id number] Date of Birth: 01/18/1971 Referring Provider: Leone Haven, MD   Encounter Date: 02/14/2018  PT End of Session - 02/14/18 0804    Visit Number  10    Number of Visits  17    Date for PT Re-Evaluation  03/08/18    PT Start Time  0804    PT Stop Time  0845    PT Time Calculation (min)  41 min    Activity Tolerance  Patient tolerated treatment well    Behavior During Therapy  [facility] for tasks assessed/performed       Past Medical History:  Diagnosis Date  . Chickenpox   . Depression   . GERD (gastroesophageal reflux disease)   . Headache   . Heart murmur   . High blood pressure   . Preeclampsia     Past Surgical History:  Procedure Laterality Date  . No surgical history    . UTERINE SEPTUM RESECTION  2008    There were no vitals filed for this visit.  Subjective Assessment - 02/14/18 0805    Subjective  Back is better than it was last week. Back was really good after last session last week for 2 days. It helped. Back pain increased again but not at the same level. Pain occurs sporadically. She can just be walking and feels pain in her L low back, such as when she is taking a step with her R LE (L LE stance phase of gait). 0/10 currently.      Pertinent History  Pt presents with chronic LBP (at least 5 years) which is mainly on the L side but does span her entire lower back. Over past few years pt has had increasing levels of issues with back pain. When pt has had flare ups in the past they typically have always self resolved with time. Since last April pt has had a significant period where "it feels like almost every activity was painful". Since flare up last April her pain has gotten better some but not significantly better. Pt  typically is very active exercising 3-4x/wk with her personal trainer but she has not exercised for the past month as her personal trainer is currently relocating. Aggravating factors: bending over, SLS when donning pants, sneezing, sit>stand, reaching. Easing factors: ice, aleve (pt doesn't like doing this). Heat does not help. No MRI. X-ray on 12/08/17: mild scoliosis concave L with diffuse mild degenerative change. No acute bony abnormality identified. SI joints intact. Left nephrolithiasis cannot be excluded. Denies changes in bowel or bladder, night sweats, sudden changes in weight. Denies any symptoms down her legs. No numbness or tingling. 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 When pt has increase in pain from an activity then her pain goes back down to her baseline ache almost immediately after getting out of that position. No pain with sitting or standing. Every now and then walking is painful. Pt's hobbies include exercising, reading, knitting. Current pain: 3-4/10, best 2-3/10, worst 6-7/10. Pt has seen a chiropractor in the past with some success. Saw a chiropractor again recently and did not see any results.     How long  can you sit comfortably?  unlimited    How long can you stand comfortably?  unlimited    How long can you walk comfortably?  unlimited    Diagnostic tests  X-ray on 12/08/17: mild scoliosis concave L with diffuse mild degenerative change. No acute bony abnormality identified. SI joints intact. Left nephrolithiasis cannot be excluded.     Patient Stated Goals  to be able to perform daily activities and work duties painfree.  To be able to complete gym routine without pain.    Currently in Pain?  No/denies    Pain Score  0-No pain                              PT Education - 02/14/18 0819    Education provided  Yes     Education Details  ther-ex, HEP    Person(s) Educated  Patient    Methods  Explanation;Demonstration;Tactile cues;Verbal cues;Handout    Comprehension  Returned demonstration;Verbalized understanding        Objectives standing: R lateral shift, R lumbar paraspinal muscle tension   Ther-ex   SLS on L LE. L lateral shift.  Standing trunk rotation: R WFL with slight central low back pain.  L WFL with slight L Low back pain (not as much as seated L trunk rotation).   Seated trunk rotation: R WFL, L limited with slight low back symptoms   Supine lower trunk rotation to the L (R upper trunk rotation): reproduced L low back pain  Supine lower trunk rotation to the R (L upper trunk rotation): no pain.   Supine bridge followed by long sitting    L LE shorter in long sit position suggesting posterior nutation of L innominate.   Supine SLR L hip flexion 10x3    Decreased low back pain with seated L trunk rotation and supine L lower trunk rotation.   Prone L hip flexion isometrics, L thigh on pillow 10x5 seconds for 3 sets    No low back symptoms with lower trunk rotation afterwards  Seated L hip flexion isometrics 10x5 seconds for 3 sets     No low back pain with seated trunk rotation R and L.     No low back pain with standing trunk rotation R and L.   Seated bilateral shoulder extension isometrics, hands on thighs 5 seconds x 10 for 3 sets   No pain with SLS on R or L LE     Improved exercise technique, movement at target joints, use of target muscles after mod verbal, visual, tactile cues.   Decreased back pain with trunk rotation with activation of L hip flexor muscles. Worked on core muscle strengthening afterwards to promote more neutral low back and pelvis position. Pt will benefit from continued skilled physical therapy services to continue strengthening pelvic and lumbar muscles, promote control, and promote ability to perform tasks with minimal to no back pain.       PT Short Term Goals - 02/05/18 0827      PT SHORT TERM GOAL #1   Title  Pt will be independent with HEP for carryover between sessions    Time  4    Period  Weeks    Status  On-going    Target Date  03/08/18        PT Long Term Goals - 02/05/18 0833      PT LONG TERM GOAL #1   Title  Pt  will improve Bil glute max and med strength to at least 4+/5 for improved functional use of each LE and to support lower back    Baseline  at least 4+/5 bilaterally (02/05/2018)    Time  4    Period  Weeks    Status  Achieved      PT LONG TERM GOAL #2   Title  Pt will improve mODI to at least 16% to demonstrate decreased effect of back pain on functional activities    Baseline  26%, 20% (02/05/2018)    Time  4    Period  Weeks    Status  On-going    Target Date  03/08/18      PT LONG TERM GOAL #3   Title  Pt will be able to perform daily activities (bending, sit>stand, reaching, donning pants) painfree for improved QOL    Baseline  painful; Still has pain but better per pt reports (02/05/2018)    Time  4    Period  Weeks    Status  Partially Met    Target Date  03/08/18      PT LONG TERM GOAL #4   Title  Pt will improve FABQpa to at least 6/24 to demonstrate decreased fear of pain with activities    Baseline  14/24; 13/30 (02/05/2018)    Time  4    Period  Weeks    Status  On-going    Target Date  03/08/18            Plan - 02/14/18 0819    Clinical Impression Statement  Decreased back pain with trunk rotation with activation of L hip flexor muscles. Worked on core muscle strengthening afterwards to promote more neutral low back and pelvis position. Pt will benefit from continued skilled physical therapy services to continue strengthening pelvic and lumbar muscles, promote control, and promote ability to perform tasks with minimal to no back pain.     Rehab Potential  Good    PT Frequency  2x / week    PT Duration  4 weeks    PT Treatment/Interventions  ADLs/Self Care  Home Management;Aquatic Therapy;Biofeedback;Cryotherapy;Electrical Stimulation;Iontophoresis 33m/ml Dexamethasone;Moist Heat;Traction;Ultrasound;Contrast Bath;DME Instruction;Gait training;Stair training;Functional mobility training;Therapeutic exercise;Therapeutic activities;Balance training;Neuromuscular re-education;Patient/family education;Orthotic Fit/Training;Manual techniques;Passive range of motion;Dry needling;Energy conservation;Taping    PT Next Visit Plan  Progress glute strengthening, spinal joint mobilizations, stretching to low back, STM R lumbar paraspinals, strengthening lower trap and rhomboids    PT Home Exercise Plan  prayer stretch forward left and right, monster walks with band around ankles, scapular retraction in standing with GTB, low trap in prone, hooklying pevlic tilt marches    Consulted and Agree with Plan of Care  Patient       Patient will benefit from skilled therapeutic intervention in order to improve the following deficits and impairments:  Abnormal gait, Decreased balance, Decreased endurance, Decreased range of motion, Decreased strength, Difficulty walking, Hypomobility, Increased fascial restricitons, Increased muscle spasms, Impaired perceived functional ability, Improper body mechanics, Postural dysfunction, Pain  Visit Diagnosis: Chronic bilateral low back pain without sciatica  Muscle weakness (generalized)     Problem List Patient Active Problem List   Diagnosis Date Noted  . Chronic left-sided low back pain without sciatica 12/08/2017  . Abnormal mammogram 12/08/2017  . Elevated hemoglobin ([facility] 06/20/2017  . Depression, major, single episode, moderate ([facility] 04/28/2017  . Abnormal uterine bleeding 03/01/2016    MJoneen BoersPT, DPT   02/14/2018, 8:50 AM  [facility] [address], [place], [address] Phone: [phone]   Fax:  [phone]  Name: Misty Fernandez MRN: [id number] Date of Birth: [birth year]

## 2018-02-14 NOTE — Patient Instructions (Addendum)
Hip Flexion / Knee Extension: Straight-Leg Raise       Lie on back. Lift left leg with knee straight.   __10_ reps per set, __3_ sets per day Copyright  VHI. All rights reserved.     Seated hip flexion isometrics  Sitting up with good posture   Press your left hand onto your left thigh as you press your left thigh up towards your hand    No movement should be seen.    Pain-free level of effort.    Hold for 5 seconds.    Repeat 10 times.    Perform 3 sets daily.     Seated bilateral shoulder extension isometrics.   Sitting with neutral back posture   Press your hands onto your thighs to feel your abdominal muscles contract.    Hold for 5 seconds.    Repeat 10 times.    Perform 3 sets daily.

## 2018-02-20 ENCOUNTER — Other Ambulatory Visit: Payer: 59

## 2018-02-21 ENCOUNTER — Ambulatory Visit
Admission: RE | Admit: 2018-02-21 | Discharge: 2018-02-21 | Disposition: A | Discharge status: Home / Self Care | Payer: 59 | Source: Ambulatory Visit | Attending: Family Medicine | Admitting: Family Medicine

## 2018-02-21 DIAGNOSIS — R928 Other abnormal and inconclusive findings on diagnostic imaging of breast: Secondary | ICD-10-CM

## 2018-02-21 DIAGNOSIS — N6489 Other specified disorders of breast: Secondary | ICD-10-CM | POA: Diagnosis not present

## 2018-02-22 ENCOUNTER — Ambulatory Visit: Payer: 59

## 2018-02-22 DIAGNOSIS — M545 Low back pain: Principal | ICD-10-CM

## 2018-02-22 DIAGNOSIS — G8929 Other chronic pain: Secondary | ICD-10-CM

## 2018-02-22 DIAGNOSIS — M6281 Muscle weakness (generalized): Secondary | ICD-10-CM

## 2018-02-22 NOTE — Therapy (Signed)
[facility] [address] [place], [address] Phone: [phone]   Fax:  [phone]  Physical Therapy Treatment  Patient Details  Name: Misty Fernandez MRN: [id number] Date of Birth: Aug 23, 1971 Referring Provider: Leone Haven, MD   Encounter Date: 02/22/2018  PT End of Session - 02/22/18 1744    Visit Number  11    Number of Visits  17    Date for PT Re-Evaluation  03/08/18    PT Start Time  5537    PT Stop Time  1837    PT Time Calculation (min)  52 min    Activity Tolerance  Patient tolerated treatment well    Behavior During Therapy  [facility] for tasks assessed/performed       Past Medical History:  Diagnosis Date  . Chickenpox   . Depression   . GERD (gastroesophageal reflux disease)   . Headache   . Heart murmur   . High blood pressure   . Preeclampsia     Past Surgical History:  Procedure Laterality Date  . No surgical history    . UTERINE SEPTUM RESECTION  2008    There were no vitals filed for this visit.  Subjective Assessment - 02/22/18 1745    Subjective  Back is good. The HEP is helping. 3-4/10 at worst for the past 7 days. Standing on her L LE is better overall. Still feels symptoms at times when performing activity.  Pt states that there is still some stuff she can work on but overall improving.  Might be fine continuing with HEP after next visit.     Pertinent History  Pt presents with chronic LBP (at least 5 years) which is mainly on the L side but does span her entire lower back. Over past few years pt has had increasing levels of issues with back pain. When pt has had flare ups in the past they typically have always self resolved with time. Since last April pt has had a significant period where "it feels like almost every activity was painful". Since flare up last April her pain has gotten better some but not significantly better. Pt typically is very active exercising 3-4x/wk  with her personal trainer but she has not exercised for the past month as her personal trainer is currently relocating. Aggravating factors: bending over, SLS when donning pants, sneezing, sit>stand, reaching. Easing factors: ice, aleve (pt doesn't like doing this). Heat does not help. No MRI. X-ray on 12/08/17: mild scoliosis concave L with diffuse mild degenerative change. No acute bony abnormality identified. SI joints intact. Left nephrolithiasis cannot be excluded. Denies changes in bowel or bladder, night sweats, sudden changes in weight. Denies any symptoms down her legs. No numbness or tingling. 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 When pt has increase in pain from an activity then her pain goes back down to her baseline ache almost immediately after getting out of that position. No pain with sitting or standing. Every now and then walking is painful. Pt's hobbies include exercising, reading, knitting. Current pain: 3-4/10, best 2-3/10, worst 6-7/10. Pt has seen a chiropractor in the past with some success. Saw a chiropractor again recently and did not see any results.     How long can you sit comfortably?  unlimited    How long can  you stand comfortably?  unlimited    How long can you walk comfortably?  unlimited    Diagnostic tests  X-ray on 12/08/17: mild scoliosis concave L with diffuse mild degenerative change. No acute bony abnormality identified. SI joints intact. Left nephrolithiasis cannot be excluded.     Patient Stated Goals  to be able to perform daily activities and work duties painfree.  To be able to complete gym routine without pain.    Currently in Pain?  No/denies    Pain Score  0-No pain                              PT Education - 02/22/18 1749    Education provided  Yes    Education Details  ther-ex    Person(s)  Educated  Patient    Methods  Explanation;Demonstration;Tactile cues;Verbal cues    Comprehension  Returned demonstration;Verbalized understanding        Objectives   Ther-ex   SLS on L LE. Improved trunk and pelvic control  SLS 10x10 seconds each LE, emphasis on level pelvis. Cues on transversus abdominis activation  T-band side stepping, red band around ankles to promote glute med muscle strengthening. 32 ft to the R and 32 ft to the L  SLS, emphasis on femoral control 6x standing on L LE    6x standing on R LE   Walking lunges 32 ft x 4, emphasis on femoral control   Single leg dead lift 8x for L LE    Then with R LE assist secondary to L trunk lean, R pelvic drop compensation 10x  Single leg dead lift 10x for R LE    Then with L LE assist secondary to R trunk lean and L pelvic drop compensation 10x  Forward step up onto 3 inch step. Mod to max cues on decreasing contralateral pelvic drop to decrease ipsilateral trunk lean. Improved with cues and practice. Control decreases with increased fatigue.     10x2 L LE     10x2 R LE    Improved exercise technique, movement at target joints, use of target muscles after mod verbal, visual, tactile cues.   Pt demonstrates overall decreased low back pain with very good carry over of decreased back pain from previous session and good compliance with HEP based on pt subjective reports. Possible graduation from formal PT sessions next visit with pt continuing with HEP at home if progress continues to go well. Improved pelvic and trunk control during static single leg stand activities (position of pt donning and doffing pants). Difficulty with control when dynamic movements were added. Continued working on hip strengthening and lumbopelvic control to help address. Pt tolerated session well without complain of pain.        PT Short Term Goals - 02/05/18 0827      PT SHORT TERM GOAL #1   Title  Pt will be independent with HEP for  carryover between sessions    Time  4    Period  Weeks    Status  On-going    Target Date  03/08/18        PT Long Term Goals - 02/05/18 7628      PT LONG TERM GOAL #1   Title  Pt will improve Bil glute max and med strength to at least 4+/5 for improved functional use of each LE and to support lower back    Baseline  at least  4+/5 bilaterally (02/05/2018)    Time  4    Period  Weeks    Status  Achieved      PT LONG TERM GOAL #2   Title  Pt will improve mODI to at least 16% to demonstrate decreased effect of back pain on functional activities    Baseline  26%, 20% (02/05/2018)    Time  4    Period  Weeks    Status  On-going    Target Date  03/08/18      PT LONG TERM GOAL #3   Title  Pt will be able to perform daily activities (bending, sit>stand, reaching, donning pants) painfree for improved QOL    Baseline  painful; Still has pain but better per pt reports (02/05/2018)    Time  4    Period  Weeks    Status  Partially Met    Target Date  03/08/18      PT LONG TERM GOAL #4   Title  Pt will improve FABQpa to at least 6/24 to demonstrate decreased fear of pain with activities    Baseline  14/24; 13/30 (02/05/2018)    Time  4    Period  Weeks    Status  On-going    Target Date  03/08/18            Plan - 02/22/18 1742    Clinical Impression Statement  Pt demonstrates overall decreased low back pain with very good carry over of decreased back pain from previous session and good compliance with HEP based on pt subjective reports. Possible graduation from formal PT sessions next visit with pt continuing with HEP at home if progress continues to go well. Improved pelvic and trunk control during static single leg stand activities (position of pt donning and doffing pants). Difficulty with control when dynamic movements were added. Continued working on hip strengthening and lumbopelvic control to help address. Pt tolerated session well without complain of pain.     Rehab  Potential  Good    PT Frequency  2x / week    PT Duration  4 weeks    PT Treatment/Interventions  ADLs/Self Care Home Management;Aquatic Therapy;Biofeedback;Cryotherapy;Electrical Stimulation;Iontophoresis 32m/ml Dexamethasone;Moist Heat;Traction;Ultrasound;Contrast Bath;DME Instruction;Gait training;Stair training;Functional mobility training;Therapeutic exercise;Therapeutic activities;Balance training;Neuromuscular re-education;Patient/family education;Orthotic Fit/Training;Manual techniques;Passive range of motion;Dry needling;Energy conservation;Taping    PT Next Visit Plan  Progress glute strengthening, spinal joint mobilizations, stretching to low back, STM R lumbar paraspinals, strengthening lower trap and rhomboids    PT Home Exercise Plan  prayer stretch forward left and right, monster walks with band around ankles, scapular retraction in standing with GTB, low trap in prone, hooklying pevlic tilt marches    Consulted and Agree with Plan of Care  Patient       Patient will benefit from skilled therapeutic intervention in order to improve the following deficits and impairments:  Abnormal gait, Decreased balance, Decreased endurance, Decreased range of motion, Decreased strength, Difficulty walking, Hypomobility, Increased fascial restricitons, Increased muscle spasms, Impaired perceived functional ability, Improper body mechanics, Postural dysfunction, Pain  Visit Diagnosis: Chronic bilateral low back pain without sciatica  Muscle weakness (generalized)     Problem List Patient Active Problem List   Diagnosis Date Noted  . Chronic left-sided low back pain without sciatica 12/08/2017  . Abnormal mammogram 12/08/2017  . [place] 06/20/2017  . Depression, major, single episode, moderate ([place] 04/28/2017  . Abnormal uterine bleeding 03/01/2016    MJoneen BoersPT, DPT   02/22/2018, 6:55  PM  [facility] [address], [place], [address] Phone: [phone]   Fax:  [phone]  Name: MARYAN SIVAK MRN: [id number] Date of Birth: August 11, 1971

## 2018-02-26 ENCOUNTER — Other Ambulatory Visit: Payer: Self-pay | Admitting: Family Medicine

## 2018-02-26 DIAGNOSIS — R928 Other abnormal and inconclusive findings on diagnostic imaging of breast: Secondary | ICD-10-CM

## 2018-02-27 ENCOUNTER — Ambulatory Visit: Payer: 59

## 2018-05-02 ENCOUNTER — Encounter: Payer: Self-pay | Admitting: Family Medicine

## 2018-05-02 ENCOUNTER — Ambulatory Visit (INDEPENDENT_AMBULATORY_CARE_PROVIDER_SITE_OTHER): Payer: 59 | Admitting: Family Medicine

## 2018-05-02 ENCOUNTER — Other Ambulatory Visit: Payer: Self-pay

## 2018-05-02 VITALS — BP 110/80 | HR 76 | Temp 98.3°F | Ht 63.0 in | Wt 121.6 lb

## 2018-05-02 DIAGNOSIS — M79671 Pain in right foot: Secondary | ICD-10-CM | POA: Insufficient documentation

## 2018-05-02 DIAGNOSIS — Z0001 Encounter for general adult medical examination with abnormal findings: Secondary | ICD-10-CM

## 2018-05-02 DIAGNOSIS — Z13220 Encounter for screening for lipoid disorders: Secondary | ICD-10-CM

## 2018-05-02 DIAGNOSIS — R928 Other abnormal and inconclusive findings on diagnostic imaging of breast: Secondary | ICD-10-CM | POA: Diagnosis not present

## 2018-05-02 NOTE — Assessment & Plan Note (Signed)
Suspect related to collapse of her transverse arch.  Discussed option of trying to find a shoe insert that has arch support in that area versus referring to sports medicine.  She opted to try to find a shoe insert.  If worsens or unable to find an insert will refer to sports medicine.

## 2018-05-02 NOTE — Patient Instructions (Signed)
Nice to see you. Please continue to work on diet and exercise. Please try to find a shoe insert like we discussed for your foot.  If this is not beneficial please let us know and we can have you see sports medicine.

## 2018-05-02 NOTE — Assessment & Plan Note (Signed)
Physical exam completed.  Encouraged continued dietary changes.  Continue exercise.  Bilateral mammogram will be ordered.  Pap smear up-to-date.  She will return for fasting lab work.

## 2018-05-02 NOTE — Progress Notes (Signed)
[name], MD Phone: [phone]  Misty Fernandez is a 47 y.o. female who presents today for cpe.  Exercises by going to the gym 3 to 4 days a week for 30 to 40 minutes. Diet could be better though is fairly healthy.  She has cut down to 1 soda per day. Mammogram up-to-date.  She is due for follow-up diagnostic imaging in August. Pap smear 03/01/2016 with negative cells and negative HPV.  She notes menstrual cycles once monthly lasting 5 to 6 days. Tetanus vaccination and flu vaccination up-to-date. Prior HIV screening with her pregnancies. No family history of breast cancer, ovarian cancer, or colon cancer. No tobacco use or illicit drug use.  Not much alcohol use. She sees a dentist more than every 6 months.  Ophthalmology once a year. She does report a knot on the bottom of her right foot near the ball the foot that possibly feels like a cramp.  Only hurts in the morning when she puts weight on it.  Gets better throughout the day.  Active Ambulatory Problems    Diagnosis Date Noted  . Encounter for general adult medical examination with abnormal findings 03/01/2016  . Abnormal uterine bleeding 03/01/2016  . Depression, major, single episode, moderate ([place]) 04/28/2017  . Elevated hemoglobin ([place]) 06/20/2017  . Chronic left-sided low back pain without sciatica 12/08/2017  . Abnormal mammogram 12/08/2017  . Right foot pain 05/02/2018   Resolved Ambulatory Problems    Diagnosis Date Noted  . Coccyx pain 01/22/2016   Past Medical History:  Diagnosis Date  . Chickenpox   . Depression   . GERD (gastroesophageal reflux disease)   . Headache   . Heart murmur   . High blood pressure   . Preeclampsia     Family History  Problem Relation Age of Onset  . Arthritis Unknown        Parent, Grandparent  . Heart disease Unknown        Parent, grandparent  . High blood pressure Unknown        Parent  . Stroke Unknown        Grandparent  . Diabetes Unknown        Parent  .  Cancer Father   . Breast cancer Neg Hx     Social History   Socioeconomic History  . Marital status: Married    Spouse name: Not on file  . Number of children: Not on file  . Years of education: Not on file  . Highest education level: Not on file  Occupational History  . Not on file  Social Needs  . Financial resource strain: Not on file  . Food insecurity:    Worry: Not on file    Inability: Not on file  . Transportation needs:    Medical: Not on file    Non-medical: Not on file  Tobacco Use  . Smoking status: Former Research scientist (life sciences)  . Smokeless tobacco: Never Used  Substance and Sexual Activity  . Alcohol use: Yes    Alcohol/week: 0.6 oz    Types: 1 Standard drinks or equivalent per week  . Drug use: No  . Sexual activity: Not on file  Lifestyle  . Physical activity:    Days per week: Not on file    Minutes per session: Not on file  . Stress: Not on file  Relationships  . Social connections:    Talks on phone: Not on file    Gets together: Not on file  Attends religious service: Not on file    Active member of club or organization: Not on file    Attends meetings of clubs or organizations: Not on file    Relationship status: Not on file  . Intimate partner violence:    Fear of current or ex partner: Not on file    Emotionally abused: Not on file    Physically abused: Not on file    Forced sexual activity: Not on file  Other Topics Concern  . Not on file  Social History Narrative  . Not on file    ROS  General:  Negative for nexplained weight loss, fever Skin: Negative for new or changing mole, sore that won't heal HEENT: Negative for trouble hearing, trouble seeing, ringing in ears, mouth sores, hoarseness, change in voice, dysphagia. CV:  Negative for chest pain, dyspnea, edema, palpitations Resp: Negative for cough, dyspnea, hemoptysis GI: Negative for nausea, vomiting, diarrhea, constipation, abdominal pain, melena, hematochezia. GU: Negative for dysuria,  incontinence, urinary hesitance, hematuria, vaginal or penile discharge, polyuria, sexual difficulty, lumps in testicle or breasts MSK: Negative for muscle cramps or aches, joint pain or swelling Neuro: Negative for headaches, weakness, numbness, dizziness, passing out/fainting Psych: Negative for depression, anxiety, memory problems  Objective  Physical Exam Vitals:   05/02/18 0826  BP: 110/80  Pulse: 76  Temp: 98.3 F (36.8 C)  SpO2: 98%    BP Readings from Last 3 Encounters:  05/02/18 110/80  12/08/17 130/86  07/06/17 123/80   Wt Readings from Last 3 Encounters:  05/02/18 121 lb 9.6 oz (55.2 kg)  12/08/17 121 lb 9.6 oz (55.2 kg)  07/06/17 116 lb 14.4 oz (53 kg)    Physical Exam  Constitutional: No distress.  HENT:  Head: Normocephalic and atraumatic.  Mouth/Throat: Oropharynx is clear and moist.  Cardiovascular: Normal rate, regular rhythm and normal heart sounds.  Pulmonary/Chest: Effort normal and breath sounds normal.  Abdominal: Soft. Bowel sounds are normal. She exhibits no distension and no mass. There is no tenderness. There is no rebound and no guarding.  Genitourinary:  Genitourinary Comments: Chaperone used, bilateral breasts with no masses, skin changes, nipple inversion, or tenderness, no axillary masses bilaterally  Musculoskeletal: She exhibits no edema.  Right foot with no tenderness over the plantar surface, no abnormal palpable lesions, no skin changes, negative squeeze test of the forefoot, 2+ DP pulse, left foot with no tenderness over the plantar surface, no abnormal palpable lesions, no skin changes, 2+ DP pulse, intact longitudinal arch bilaterally, collapsed transverse arch bilaterally with splaying of her toes  Neurological: She is alert.  Skin: Skin is warm and dry. She is not diaphoretic.  Psychiatric: She has a normal mood and affect.     Assessment/Plan:   Encounter for general adult medical examination with abnormal findings Physical  exam completed.  Encouraged continued dietary changes.  Continue exercise.  Bilateral mammogram will be ordered.  Pap smear up-to-date.  She will return for fasting lab work.  Right foot pain Suspect related to collapse of her transverse arch.  Discussed option of trying to find a shoe insert that has arch support in that area versus referring to sports medicine.  She opted to try to find a shoe insert.  If worsens or unable to find an insert will refer to sports medicine.   Orders Placed This Encounter  Procedures  . MM DIAG BREAST TOMO BILATERAL    Standing Status:   Future    Standing Expiration Date:  07/03/2019    Order Specific Question:   Reason for Exam (SYMPTOM  OR DIAGNOSIS REQUIRED)    Answer:   prior abnormal mammogram, follow-up imaging    Order Specific Question:   Is the patient pregnant?    Answer:   No    Order Specific Question:   Preferred imaging location?    Answer:   [facility]  . Comp Met ([facility])    Standing Status:   Future    Standing Expiration Date:   05/03/2019  . Lipid panel    Standing Status:   Future    Standing Expiration Date:   05/03/2019    No orders of the defined types were placed in this encounter.    [name], MD [place]

## 2018-05-06 ENCOUNTER — Other Ambulatory Visit: Payer: Self-pay | Admitting: Family Medicine

## 2018-05-23 ENCOUNTER — Encounter: Payer: Self-pay | Admitting: Family Medicine

## 2018-05-30 ENCOUNTER — Ambulatory Visit (INDEPENDENT_AMBULATORY_CARE_PROVIDER_SITE_OTHER): Payer: 59 | Admitting: Family Medicine

## 2018-05-30 ENCOUNTER — Encounter: Payer: Self-pay | Admitting: Family Medicine

## 2018-05-30 VITALS — BP 138/88 | HR 85 | Temp 98.6°F | Ht 63.0 in | Wt 120.5 lb

## 2018-05-30 DIAGNOSIS — R21 Rash and other nonspecific skin eruption: Secondary | ICD-10-CM

## 2018-05-30 NOTE — Patient Instructions (Signed)
Take daily Zyrtec for itch x 7-10 days  Use steroid cream on affected areas 2/x day  Let me know if not better in 2 days

## 2018-05-30 NOTE — Progress Notes (Signed)
Subjective:    Patient ID: Misty RazorDonna R Siebers, female    DOB: 12-01-1971, 47 y.o.   MRN: [id number]  HPI This is a 47 yo female who presents today with rash on arms, legs and abdomen. First noticed rash 5/18 on arms after working to clean up vines in her yard. Went to [facility] a couple of days later and was given prednisone taper 5 mg tabs (4 tabs TID x 3, 3 x 3, 2x3, 1 x3). No improvement. Went back June 1 and was given an injection of methylprednisone 125 mg with some improvement. New rash appeared on abdomen over last 2 days.  No other new meds. No new soaps, shampoos, lotions, detergents or travel. New rash is itchy. Was taking benadryl with some relief but makes her tired.  No fever, no headache, has felt a little off, no muscle aches. No known tick exposure  Past Medical History:  Diagnosis Date  . Chickenpox   . Depression   . GERD (gastroesophageal reflux disease)   . Headache   . Heart murmur   . High blood pressure   . Preeclampsia    Past Surgical History:  Procedure Laterality Date  . No surgical history    . UTERINE SEPTUM RESECTION  2008   Family History  Problem Relation Age of Onset  . Arthritis Unknown        Parent, Grandparent  . Heart disease Unknown        Parent, grandparent  . High blood pressure Unknown        Parent  . Stroke Unknown        Grandparent  . Diabetes Unknown        Parent  . Cancer Father   . Breast cancer Neg Hx    Social History   Tobacco Use  . Smoking status: Former Games developermoker  . Smokeless tobacco: Never Used  Substance Use Topics  . Alcohol use: Yes    Alcohol/week: 0.6 oz    Types: 1 Standard drinks or equivalent per week  . Drug use: No       Review of Systems Per HPI    Objective:   Physical Exam  Constitutional: She is oriented to person, place, and time. She appears well-developed and well-nourished.  HENT:  Head: Normocephalic and atraumatic.  Eyes: Conjunctivae are normal.  Cardiovascular: Normal rate.    Pulmonary/Chest: Effort normal.  Neurological: She is alert and oriented to person, place, and time.  Skin: Skin is warm and dry. Rash noted.  Arms with scattered, resolving linear lesions. Areas scabbed, no drainage or erythema.  Lower abdomen with patchy areas of erythematous papules.    Psychiatric: She has a normal mood and affect. Her behavior is normal. Judgment and thought content normal.  Vitals reviewed.     BP 138/88 (BP Location: Right Arm, Patient Position: Sitting, Cuff Size: Normal)   Pulse 85   Temp 98.6 F (37 C) (Oral)   Ht 5\' 3"  (1.6 m)   Wt 120 lb 8 oz (54.7 kg)   SpO2 97%   BMI 21.35 kg/m  Wt Readings from Last 3 Encounters:  05/30/18 120 lb 8 oz (54.7 kg)  05/02/18 121 lb 9.6 oz (55.2 kg)  12/08/17 121 lb 9.6 oz (55.2 kg)       Assessment & Plan:  1. Rash - resolving lesions on arms - abdominal rash with different appearance, suspect contact dermatitis, suggested continued use of steroid cream, daily antihistamine - RTC precautions reviewed -  encouraged her to avoid scratching   Olean Ree, FNP-BC  [facility]  05/30/2018 2:04 PM

## 2018-06-01 ENCOUNTER — Encounter: Payer: Self-pay | Admitting: Family Medicine

## 2018-08-06 ENCOUNTER — Other Ambulatory Visit: Payer: Self-pay | Admitting: Family Medicine

## 2018-08-22 ENCOUNTER — Other Ambulatory Visit: Payer: 59

## 2018-11-06 ENCOUNTER — Other Ambulatory Visit: Payer: Self-pay | Admitting: Family Medicine

## 2019-02-18 ENCOUNTER — Other Ambulatory Visit: Payer: Self-pay | Admitting: Family Medicine

## 2019-02-18 MED ORDER — BUPROPION HCL ER (XL) 300 MG PO TB24: 300.0000 mg | ORAL_TABLET | Freq: Every day | ORAL | 0 refills | Status: DC

## 2019-05-04 ENCOUNTER — Telehealth: Payer: Self-pay | Admitting: Family Medicine

## 2019-05-04 NOTE — Telephone Encounter (Signed)
Please contact the patient.  I received a reminder that she had not completed the previously ordered mammogram to follow-up on an abnormal mammogram.  Please see if she is willing to undergo follow-up imaging for this.  I would recommend that she undergo this.  Please also confirm whether or not she is continuing to come to our office for primary care as it has been a year since she has been evaluated.

## 2019-05-14 ENCOUNTER — Other Ambulatory Visit: Payer: Self-pay

## 2019-05-14 MED ORDER — BUPROPION HCL ER (XL) 300 MG PO TB24: 300.0000 mg | ORAL_TABLET | Freq: Every day | ORAL | 0 refills | Status: DC

## 2019-05-14 NOTE — Telephone Encounter (Signed)
Pt does plan to continue care with [facility]. Pt scheduled an appt. Pt is willing to do the follow up on the MM but would like to wait until the COVID-19 virus is under better control.  Pt scheduled for a doxyme June 12 for follow up

## 2019-05-14 NOTE — Telephone Encounter (Signed)
Noted.  If she does not want to proceed currently with the mammogram we can discuss further at her follow-up in June.

## 2019-05-15 NOTE — Telephone Encounter (Signed)
Called pt and left a detailed VM advised to call back if needed.

## 2019-05-22 ENCOUNTER — Encounter: Payer: Self-pay | Admitting: Family Medicine

## 2019-05-27 NOTE — Telephone Encounter (Signed)
Scheduled patient for Wednesday 05/29/19 FYI

## 2019-05-29 ENCOUNTER — Encounter: Payer: Self-pay | Admitting: Family Medicine

## 2019-05-29 ENCOUNTER — Ambulatory Visit: Payer: 59 | Admitting: Family Medicine

## 2019-05-29 ENCOUNTER — Ambulatory Visit (INDEPENDENT_AMBULATORY_CARE_PROVIDER_SITE_OTHER): Payer: 59 | Admitting: Family Medicine

## 2019-05-29 ENCOUNTER — Other Ambulatory Visit: Payer: Self-pay

## 2019-05-29 DIAGNOSIS — F321 Major depressive disorder, single episode, moderate: Secondary | ICD-10-CM | POA: Diagnosis not present

## 2019-05-29 DIAGNOSIS — Z130 Encounter for screening for diseases of the blood and blood-forming organs and certain disorders involving the immune mechanism: Secondary | ICD-10-CM

## 2019-05-29 DIAGNOSIS — Z13220 Encounter for screening for lipoid disorders: Secondary | ICD-10-CM | POA: Diagnosis not present

## 2019-05-29 DIAGNOSIS — Z0001 Encounter for general adult medical examination with abnormal findings: Secondary | ICD-10-CM | POA: Diagnosis not present

## 2019-05-29 DIAGNOSIS — Z1329 Encounter for screening for other suspected endocrine disorder: Secondary | ICD-10-CM

## 2019-05-29 DIAGNOSIS — N926 Irregular menstruation, unspecified: Secondary | ICD-10-CM

## 2019-05-29 DIAGNOSIS — R928 Other abnormal and inconclusive findings on diagnostic imaging of breast: Secondary | ICD-10-CM | POA: Diagnosis not present

## 2019-05-29 DIAGNOSIS — G44209 Tension-type headache, unspecified, not intractable: Secondary | ICD-10-CM | POA: Insufficient documentation

## 2019-05-29 NOTE — Assessment & Plan Note (Addendum)
Physical exam completed.  Encouraged continued diet and activity.  Pap smear up-to-date.  Discussed doing a pelvic exam at her physical next year.  She may be perimenopausal or menopausal given her menstrual history recently.  We will check an FSH.  She will come in for lab work and have a tetanus vaccine done at the same time.  Lab work as outlined below.

## 2019-05-29 NOTE — Assessment & Plan Note (Signed)
Prior findings likely benign though I did encourage her to get this scheduled given that there is always risk that there could be an underlying cancerous process.  She is deferring this currently given the COVID-19 pandemic.

## 2019-05-29 NOTE — Assessment & Plan Note (Signed)
Patient reports this is relatively stable.  She declines alteration to her medication regimen.  She will monitor.

## 2019-05-29 NOTE — Assessment & Plan Note (Signed)
>>  ASSESSMENT AND PLAN FOR DEPRESSION, MAJOR, SINGLE EPISODE, MODERATE ([facility]) WRITTEN ON 05/29/2019  5:50 PM BY SONNENBERG, ERIC G, MD  Patient reports this is relatively stable.  She declines alteration to her medication regimen.  She will monitor.

## 2019-05-29 NOTE — Assessment & Plan Note (Signed)
Slight increased frequency recently with increased stress levels.  She does have a history of these headaches.  Reports no other associated symptoms.  Discussed monitoring and if not improving or if they are worsening or she develops other symptoms being reevaluated.

## 2019-05-29 NOTE — Progress Notes (Signed)
Virtual Visit via video Note  This visit type was conducted due to national recommendations for restrictions regarding the COVID-19 pandemic (e.g. social distancing).  This format is felt to be most appropriate for this patient at this time.  All issues noted in this document were discussed and addressed.  No physical exam was performed (except for noted visual exam findings with Video Visits).   I connected with Misty Fernandez today at  3:45 PM EDT by a video enabled telemedicine application and verified that I am speaking with the correct person using two identifiers. Location patient: work Location provider: work Persons participating in the virtual visit: patient, provider  I discussed the limitations, risks, security and privacy concerns of performing an evaluation and management service by telephone and the availability of in person appointments. I also discussed with the patient that there may be a patient responsible charge related to this service. The patient expressed understanding and agreed to proceed.  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  Lifts weights and does high intensity interval training. Diet: This consists of regular food.  No excessive fried or fatty foods.  They have been eating at home recently. Pap smear up-to-date with negative cells and negative HPV.  No history of abnormal Pap smears.  She does report her last menstrual cycle was in November though it had been a number of months before that that she had her previous menstrual cycle. Mammogram is due though she wants to defer this given COVID-19 pandemic. No family history of breast cancer, ovarian cancer, or colon cancer. Due for tetanus vaccine. Former smoker.  No tobacco use currently.  No illicit drug use.  Occasional alcohol use. She sees a Pharmacist, community and an ophthalmologist.  Depression: Patient does note some stress and depression with what is going on with  the COVID-19 pandemic though nothing excessive.  She does not want to change her medication.  No SI.  Headaches: Patient has a chronic history of headaches though notes they are a little more frequent recently.  They occur in the frontal area behind her eyes and occasionally posteriorly.  She notes increased stress recently.  They do not wake her from sleep.  No vision changes, numbness, or weakness.  She does note she got new glasses and thinks those may be playing a role as well.  Review of Symptoms  General:  Negative for nexplained weight loss, fever Skin: Negative for new or changing mole, sore that won't heal HEENT: Negative for trouble hearing, trouble seeing, ringing in ears, mouth sores, hoarseness, change in voice, dysphagia. CV:  Negative for chest pain, dyspnea, edema, palpitations Resp: Negative for cough, dyspnea, hemoptysis GI: Negative for nausea, vomiting, diarrhea, constipation, abdominal pain, melena, hematochezia. GU: Negative for dysuria, incontinence, urinary hesitance, hematuria, vaginal or penile discharge, polyuria, sexual difficulty, lumps in testicle or breasts MSK: Negative for muscle cramps or aches, joint pain or swelling Neuro: Positive for headaches, weakness, numbness, dizziness, passing out/fainting Psych: Positive for depression, negative for anxiety, memory problems    Past Medical History:  Diagnosis Date  . Chickenpox   . Depression   . GERD (gastroesophageal reflux disease)   . Headache   . Heart murmur   . High blood pressure   . Preeclampsia     Past Surgical History:  Procedure Laterality Date  . No surgical history    . UTERINE SEPTUM RESECTION  2008    Family History  Problem Relation Age of Onset  . Arthritis Unknown        Parent, Grandparent  . Heart disease Unknown        Parent, grandparent  . High blood pressure Unknown        Parent  . Stroke Unknown        Grandparent  . Diabetes Unknown        Parent  . Cancer Father    . Breast cancer Neg Hx     SOCIAL HX: Former smoker   Current Outpatient Medications:  .  buPROPion (WELLBUTRIN XL) 300 MG 24 hr tablet, Take 1 tablet (300 mg total) by mouth daily., Disp: 90 tablet, Rfl: 0 .  clobetasol cream (TEMOVATE) 0.05 %, APP THIN LAYER AA BID, Disp: , Rfl: 0  EXAM:  VITALS per patient if applicable: None.  GENERAL: alert, oriented, appears well and in no acute distress  HEENT: atraumatic, conjunttiva clear, no obvious abnormalities on inspection of external nose and ears  NECK: normal movements of the head and neck  LUNGS: on inspection no signs of respiratory distress, breathing rate appears normal, no obvious gross SOB, gasping or wheezing  CV: no obvious cyanosis  MS: moves all visible extremities without noticeable abnormality  PSYCH/NEURO: pleasant and cooperative, no obvious depression or anxiety, speech and thought processing grossly intact  ASSESSMENT AND PLAN:  Discussed the following assessment and plan:  Encounter for general adult medical examination with abnormal findings  Abnormal mammogram  Depression, major, single episode, moderate (HCC)  Lipid screening - Plan: Comp Met (CMET), Lipid panel  Thyroid disorder screen - Plan: TSH  Screening for deficiency anemia - Plan: CBC  Abnormal menses - Plan: FSH  Tension headache  Encounter for general adult medical examination with abnormal findings Physical exam completed.  Encouraged continued diet and activity.  Pap smear up-to-date.  Discussed doing a pelvic exam at her physical next year.  She may be perimenopausal or menopausal given her menstrual history recently.  We will check an [place].  She will come in for lab work and have a tetanus vaccine done at the same time.  Lab work as outlined below.  Abnormal mammogram Prior findings likely benign though I did encourage her to get this scheduled given that there is always risk that there could be an underlying cancerous process.   She is deferring this currently given the COVID-19 pandemic.  Depression, major, single episode, moderate ([place]) Patient reports this is relatively stable.  She declines alteration to her medication regimen.  She will monitor.  Tension headache Slight increased frequency recently with increased stress levels.  She does have a history of these headaches.  Reports no other associated symptoms.  Discussed monitoring and if not improving or if they are worsening or she develops other symptoms being reevaluated.  [place] office staff will contact the patient to get her scheduled for labs and for a physical in 1 year.  Social distancing precautions and sick precautions discussed regarding COVID-19.   I discussed the assessment and treatment plan with the patient. The patient was provided an opportunity to ask questions and all were answered. The patient agreed with the plan and demonstrated an understanding of the instructions.   The patient was advised to call back or seek an in-person evaluation if the symptoms worsen or if the condition fails to improve as anticipated.   [name], MD

## 2019-06-07 ENCOUNTER — Ambulatory Visit: Payer: 59 | Admitting: Family Medicine

## 2019-08-10 ENCOUNTER — Other Ambulatory Visit: Payer: Self-pay | Admitting: Family Medicine

## 2019-08-12 ENCOUNTER — Encounter: Payer: Self-pay | Admitting: Family Medicine

## 2019-08-21 ENCOUNTER — Other Ambulatory Visit: Payer: Self-pay

## 2019-08-21 ENCOUNTER — Telehealth: Payer: Self-pay | Admitting: Family Medicine

## 2019-08-21 ENCOUNTER — Other Ambulatory Visit (INDEPENDENT_AMBULATORY_CARE_PROVIDER_SITE_OTHER): Payer: 59

## 2019-08-21 DIAGNOSIS — N926 Irregular menstruation, unspecified: Secondary | ICD-10-CM | POA: Diagnosis not present

## 2019-08-21 DIAGNOSIS — Z130 Encounter for screening for diseases of the blood and blood-forming organs and certain disorders involving the immune mechanism: Secondary | ICD-10-CM

## 2019-08-21 DIAGNOSIS — Z1329 Encounter for screening for other suspected endocrine disorder: Secondary | ICD-10-CM | POA: Diagnosis not present

## 2019-08-21 DIAGNOSIS — Z13220 Encounter for screening for lipoid disorders: Secondary | ICD-10-CM

## 2019-08-21 LAB — CBC
HCT: 45.4 % (ref 36.0–46.0)
Hemoglobin: 15.6 g/dL — ABNORMAL HIGH (ref 12.0–15.0)
MCHC: 34.3 g/dL (ref 30.0–36.0)
MCV: 92.3 fl (ref 78.0–100.0)
Platelets: 251 10*3/uL (ref 150.0–400.0)
RBC: 4.91 Mil/uL (ref 3.87–5.11)
RDW: 12.3 % (ref 11.5–15.5)
WBC: 5 10*3/uL (ref 4.0–10.5)

## 2019-08-21 LAB — LIPID PANEL
Cholesterol: 208 mg/dL — ABNORMAL HIGH (ref 0–200)
HDL: 63.5 mg/dL (ref >39.00)
LDL Cholesterol: 110 mg/dL — ABNORMAL HIGH (ref 0–99)
NonHDL: 144.61
Total CHOL/HDL Ratio: 3
Triglycerides: 171 mg/dL — ABNORMAL HIGH (ref 0.0–149.0)
VLDL: 34.2 mg/dL (ref 0.0–40.0)

## 2019-08-21 LAB — COMPREHENSIVE METABOLIC PANEL
ALT: 15 U/L (ref 0–35)
AST: 26 U/L (ref 0–37)
Albumin: 4.8 g/dL (ref 3.5–5.2)
Alkaline Phosphatase: 63 U/L (ref 39–117)
BUN: 29 mg/dL — ABNORMAL HIGH (ref 6–23)
CO2: 32 mEq/L (ref 19–32)
Calcium: 10.8 mg/dL — ABNORMAL HIGH (ref 8.4–10.5)
Chloride: 97 mEq/L (ref 96–112)
Creatinine, Ser: 1.08 mg/dL (ref 0.40–1.20)
GFR: 53.88 mL/min — ABNORMAL LOW (ref >60.00)
Glucose, Bld: 86 mg/dL (ref 70–99)
Potassium: 3.4 mEq/L — ABNORMAL LOW (ref 3.5–5.1)
Sodium: 141 mEq/L (ref 135–145)
Total Bilirubin: 0.5 mg/dL (ref 0.2–1.2)
Total Protein: 7.3 g/dL (ref 6.0–8.3)

## 2019-08-21 LAB — TSH: TSH: 0.87 u[IU]/mL (ref 0.35–4.50)

## 2019-08-21 LAB — FOLLICLE STIMULATING HORMONE: FSH: 187.7 m[IU]/mL

## 2019-08-21 NOTE — Telephone Encounter (Signed)
Pt dropped off a health form for work. She needs this asap. Pt was advised that the office has 5 to 7 days to complete form. Form is up front in Dr. Ellen Henri color folder.

## 2019-08-29 ENCOUNTER — Other Ambulatory Visit: Payer: Self-pay | Admitting: Family Medicine

## 2019-08-29 DIAGNOSIS — D582 Other hemoglobinopathies: Secondary | ICD-10-CM

## 2019-08-29 DIAGNOSIS — R944 Abnormal results of kidney function studies: Secondary | ICD-10-CM

## 2019-09-10 NOTE — Telephone Encounter (Signed)
I never received this phone message. It appears this was due last week based on the date on the form. Form has now been completed. Please fax and let the patient know that this has now been completed. Please apologize for me that this was not completed by the date on the form and see if she needs [place] to contact the insurance company to explain. Thanks.

## 2019-09-10 NOTE — Telephone Encounter (Signed)
patient stated its okay she does not need it anymore because it is to late to turn it in, I did apologize to the patient and she understood.  She stated to disregard the form.  Misty Fernandez,cma

## 2019-09-18 ENCOUNTER — Ambulatory Visit: Payer: 59

## 2019-10-08 IMAGING — DX DG LUMBAR SPINE COMPLETE 4+V
5 series · 5 of 5 positions shown · non-contrast
Comparison: No prior.

CLINICAL DATA: Chronic back pain.  SI joint pain.

EXAM:
LUMBAR SPINE - COMPLETE 4+ VIEW

[lumbar spine ap]
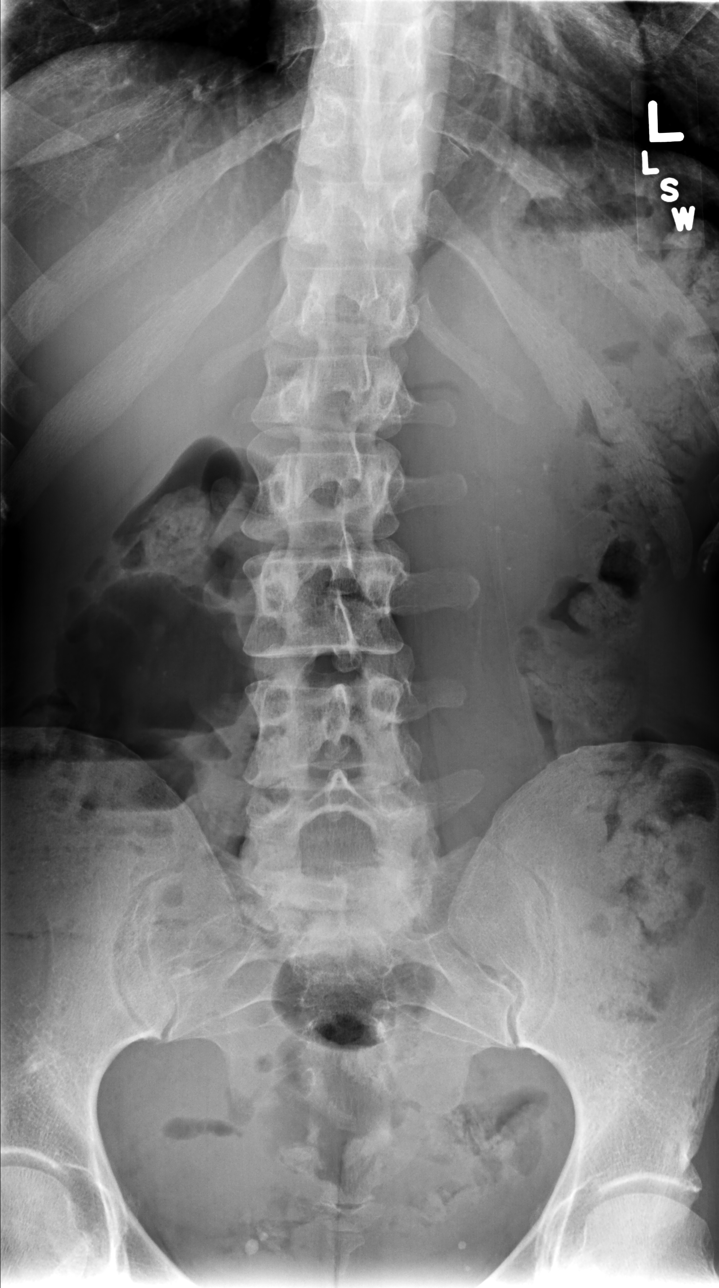

[lumbar spine obl (oblique) (1 of 2)]
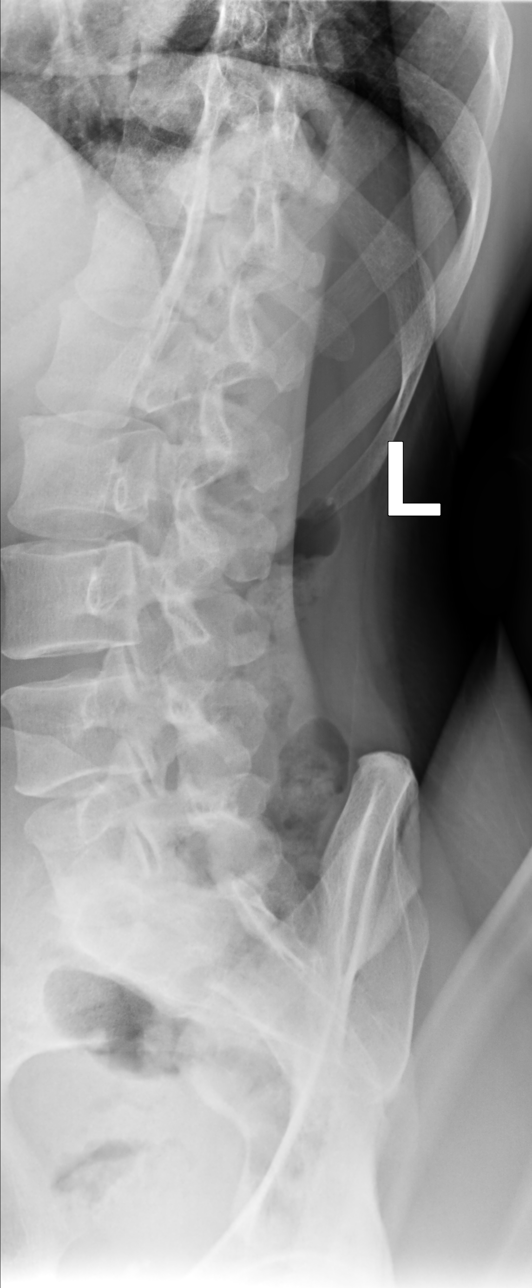

[lumbar spine obl (oblique) (2 of 2)]
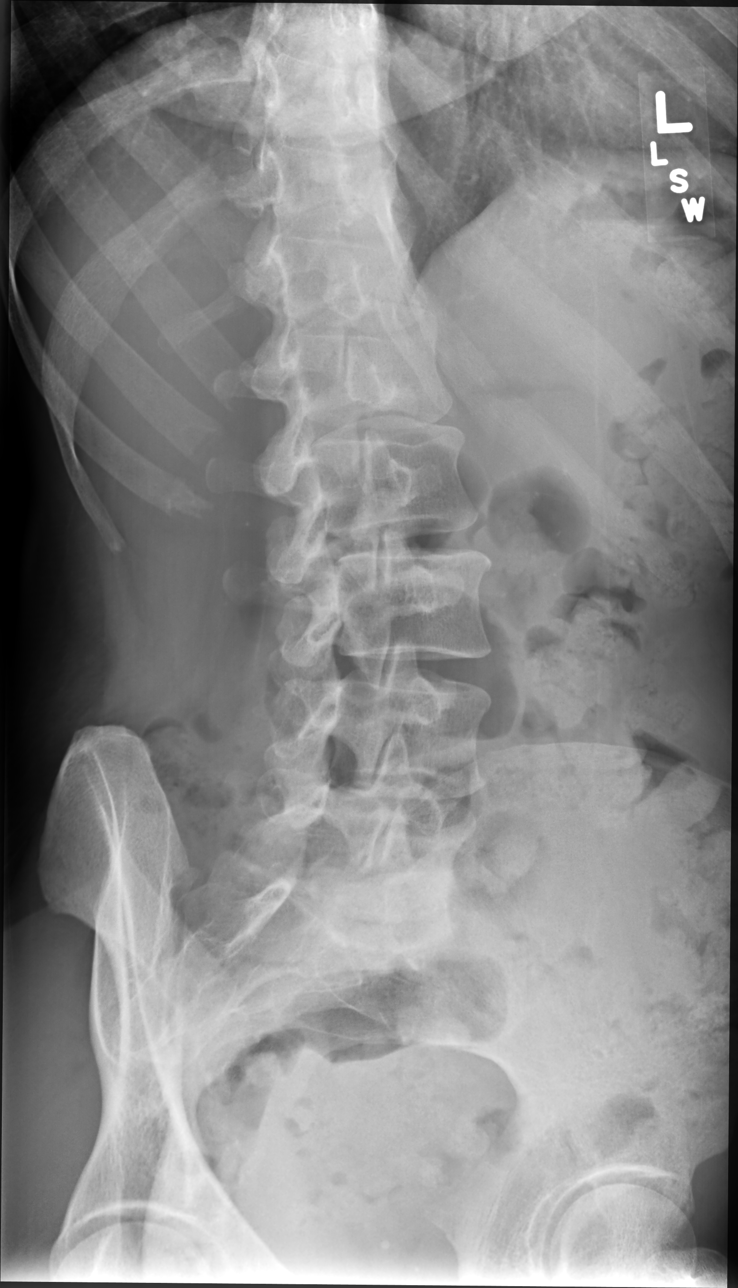

[lumbar spine lat]
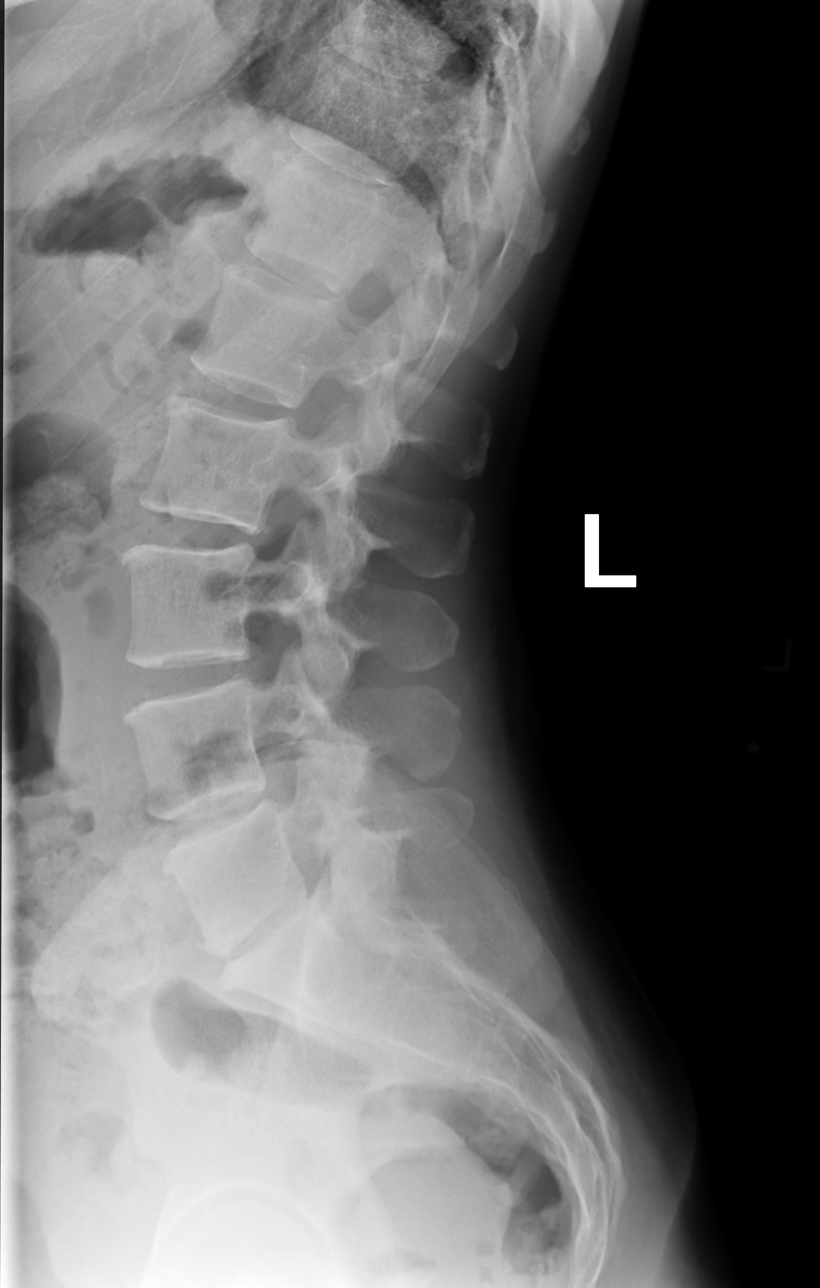

[lumbar spot lat]
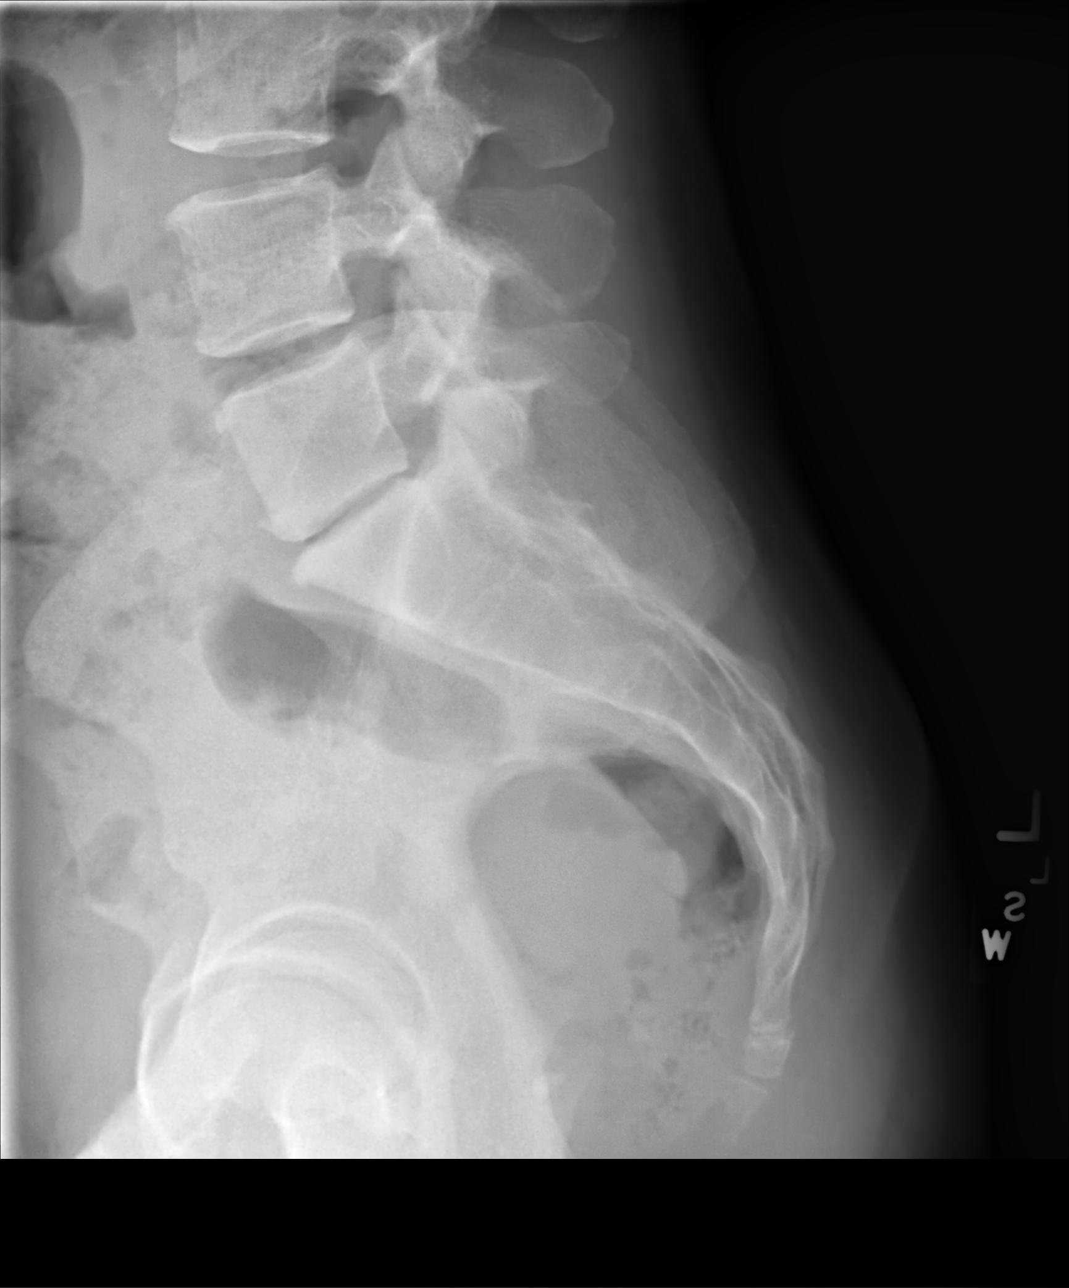

[5 of 5 positions shown; findings below may reference images not displayed]

FINDINGS: Mild scoliosis concave left. Diffuse mild degenerative change. No
acute bony abnormality identified. SI joints intact. Calcific
densities in the left kidney suggesting left nephrolithiasis.
Calcific densities in the pelvis consistent with phleboliths.
IMPRESSION: 1. Mild scoliosis concave left with diffuse mile degenerative
change. No acute bony abnormality identified. SI joints intact.

2. Left nephrolithiasis cannot be excluded.

## 2019-10-25 ENCOUNTER — Encounter: Payer: Self-pay | Admitting: Family Medicine

## 2019-10-25 MED ORDER — BUPROPION HCL ER (XL) 300 MG PO TB24: 300.0000 mg | ORAL_TABLET | Freq: Every day | ORAL | 2 refills | Status: DC

## 2019-11-25 ENCOUNTER — Encounter: Payer: Self-pay | Admitting: Family Medicine

## 2019-12-09 ENCOUNTER — Encounter: Payer: Self-pay | Admitting: Family Medicine

## 2019-12-09 ENCOUNTER — Ambulatory Visit (INDEPENDENT_AMBULATORY_CARE_PROVIDER_SITE_OTHER): Payer: 59 | Admitting: Family Medicine

## 2019-12-09 ENCOUNTER — Other Ambulatory Visit: Payer: Self-pay

## 2019-12-09 VITALS — Ht 63.0 in | Wt 120.0 lb

## 2019-12-09 DIAGNOSIS — R928 Other abnormal and inconclusive findings on diagnostic imaging of breast: Secondary | ICD-10-CM | POA: Diagnosis not present

## 2019-12-09 DIAGNOSIS — G5603 Carpal tunnel syndrome, bilateral upper limbs: Secondary | ICD-10-CM | POA: Insufficient documentation

## 2019-12-09 MED ORDER — WRIST SPLINT/COCK-UP/LEFT SM MISC: 0 refills | Status: DC

## 2019-12-09 MED ORDER — WRIST SPLINT/COCK-UP/RIGHT SM MISC: 0 refills | Status: DC

## 2019-12-09 NOTE — Progress Notes (Signed)
Virtual Visit via video Note  This visit type was conducted due to national recommendations for restrictions regarding the COVID-19 pandemic (e.g. social distancing).  This format is felt to be most appropriate for this patient at this time.  All issues noted in this document were discussed and addressed.  No physical exam was performed (except for noted visual exam findings with Video Visits).   I connected with Fernanda Twaddell today at 11:00 AM EST by a video enabled telemedicine application and verified that I am speaking with the correct person using two identifiers. Location patient: work Location provider: work  Persons participating in the virtual visit: patient, provider  I discussed the limitations, risks, security and privacy concerns of performing an evaluation and management service by telephone and the availability of in person appointments. I also discussed with the patient that there may be a patient responsible charge related to this service. The patient expressed understanding and agreed to proceed.   Reason for visit: same day   HPI: Hand numbness: Patient notes for some time now she has had intermittent numbness in her bilateral hands left greater than right.  She notes in the past she was able to shake them and they would resolve though has become more of a consistent issue recently.  She notes it feels like her hands are falling asleep when this occurs.  Typically occurs when she is driving or holding her phone or knitting.  She notes no weakness.  She does note some tingling.  She notes no numbness or tingling elsewhere in her body.  No injury to her hands.  No neck pain.  She describes it as being on the palmar aspect of her hand including the ulnar and radial aspects.  Abnormal mammogram: Patient reports she is ready to get repeat imaging scheduled.  She notes no masses or lumps.   ROS: See pertinent positives and negatives per HPI.  Past Medical History:  Diagnosis  Date  . Chickenpox   . Depression   . GERD (gastroesophageal reflux disease)   . Headache   . Heart murmur   . High blood pressure   . Preeclampsia     Past Surgical History:  Procedure Laterality Date  . No surgical history    . UTERINE SEPTUM RESECTION  2008    Family History  Problem Relation Age of Onset  . Arthritis Unknown        Parent, Grandparent  . Heart disease Unknown        Parent, grandparent  . High blood pressure Unknown        Parent  . Stroke Unknown        Grandparent  . Diabetes Unknown        Parent  . Cancer Father   . Breast cancer Neg Hx     SOCIAL HX: Former smoker   Current Outpatient Medications:  .  buPROPion (WELLBUTRIN XL) 300 MG 24 hr tablet, Take 1 tablet (300 mg total) by mouth daily., Disp: 30 tablet, Rfl: 2 .  clobetasol cream (TEMOVATE) 0.05 %, APP THIN LAYER AA BID, Disp: , Rfl: 0 .  Elastic Bandages & Supports (WRIST SPLINT/COCK-UP/LEFT SM) MISC, Apply once daily., Disp: 1 each, Rfl: 0 .  Elastic Bandages & Supports (WRIST SPLINT/COCK-UP/RIGHT SM) MISC, Apply once daily, Disp: 1 each, Rfl: 0  EXAM:  VITALS per patient if applicable: None  GENERAL: alert, oriented, appears well and in no acute distress  HEENT: atraumatic, conjunttiva clear, no obvious abnormalities on  inspection of external nose and ears  NECK: normal movements of the head and neck  LUNGS: on inspection no signs of respiratory distress, breathing rate appears normal, no obvious gross SOB, gasping or wheezing  CV: no obvious cyanosis  MS: moves all visible extremities without noticeable abnormality, patient performed Tinel's testing at bilateral volar aspect wrist with negative results, she performed Phalen's testing with positive results  PSYCH/NEURO: pleasant and cooperative, no obvious depression or anxiety, speech and thought processing grossly intact  ASSESSMENT AND PLAN:  Discussed the following assessment and plan:  Abnormal mammogram Follow-up  mammogram ordered.  Bilateral carpal tunnel syndrome Symptoms are consistent with carpal tunnel syndrome.  She has positive Phalen's testing.  We will place her in cock up splints to be worn at night.  These were sent to a medical supply store for her to get.  Will refer to orthopedics given persistent symptoms.  Discussed if she developed any weakness or new symptoms or spreading numbness or tingling she needs to let us know immediately.    I discussed the assessment and treatment plan with the patient. The patient was provided an opportunity to ask questions and all were answered. The patient agreed with the plan and demonstrated an understanding of the instructions.   The patient was advised to call back or seek an in-person evaluation if the symptoms worsen or if the condition fails to improve as anticipated.   Marikay Alar, MD

## 2019-12-09 NOTE — Assessment & Plan Note (Signed)
Follow up mammogram ordered.

## 2019-12-09 NOTE — Assessment & Plan Note (Signed)
Symptoms are consistent with carpal tunnel syndrome.  She has positive Phalen's testing.  We will place her in cock up splints to be worn at night.  These were sent to a medical supply store for her to get.  Will refer to orthopedics given persistent symptoms.  Discussed if she developed any weakness or new symptoms or spreading numbness or tingling she needs to let us know immediately.

## 2019-12-30 ENCOUNTER — Ambulatory Visit
Admission: RE | Admit: 2019-12-30 | Discharge: 2019-12-30 | Disposition: A | Discharge status: Home / Self Care | Payer: 59 | Source: Ambulatory Visit | Attending: Family Medicine | Admitting: Family Medicine

## 2019-12-30 DIAGNOSIS — R928 Other abnormal and inconclusive findings on diagnostic imaging of breast: Secondary | ICD-10-CM

## 2020-02-03 ENCOUNTER — Encounter: Payer: Self-pay | Admitting: Family Medicine

## 2020-02-05 DIAGNOSIS — R200 Anesthesia of skin: Secondary | ICD-10-CM | POA: Insufficient documentation

## 2020-02-05 DIAGNOSIS — R202 Paresthesia of skin: Secondary | ICD-10-CM | POA: Insufficient documentation

## 2020-02-07 NOTE — Telephone Encounter (Signed)
Called and left a message for the patient to informed her that the medication was on backorder and she needed to try another pharmacy if she wanted her brand name medication.  Nina,cma

## 2020-02-11 MED ORDER — BUPROPION HCL ER (XL) 300 MG PO TB24: 300.0000 mg | ORAL_TABLET | Freq: Every day | ORAL | 2 refills | Status: DC

## 2020-03-07 ENCOUNTER — Ambulatory Visit: Payer: 59

## 2020-08-24 ENCOUNTER — Telehealth: Payer: Self-pay | Admitting: Family Medicine

## 2020-08-24 DIAGNOSIS — Z1329 Encounter for screening for other suspected endocrine disorder: Secondary | ICD-10-CM

## 2020-08-24 DIAGNOSIS — Z0001 Encounter for general adult medical examination with abnormal findings: Secondary | ICD-10-CM

## 2020-08-24 DIAGNOSIS — Z13220 Encounter for screening for lipoid disorders: Secondary | ICD-10-CM

## 2020-08-24 DIAGNOSIS — Z130 Encounter for screening for diseases of the blood and blood-forming organs and certain disorders involving the immune mechanism: Secondary | ICD-10-CM

## 2020-08-24 NOTE — Addendum Note (Signed)
Addended by: Charlyne Mom D on: 08/24/2020 03:50 PM   Modules accepted: Orders

## 2020-08-24 NOTE — Telephone Encounter (Signed)
I called the patient and inforemd her that she needed a lab appointment and a physical, she understood and scheduled both labs and physical.  Misty Fernandez,cma

## 2020-08-24 NOTE — Telephone Encounter (Signed)
It looks like this patient had lab work ordered last year that was never scheduled for her.  She needs to have a CMP and CBC.  It also looks like she is due for her yearly lab work.  If she is willing we can get her scheduled for that and a physical.

## 2020-09-16 ENCOUNTER — Other Ambulatory Visit: Payer: Self-pay

## 2020-09-16 ENCOUNTER — Other Ambulatory Visit (INDEPENDENT_AMBULATORY_CARE_PROVIDER_SITE_OTHER): Payer: 59

## 2020-09-16 DIAGNOSIS — Z13220 Encounter for screening for lipoid disorders: Secondary | ICD-10-CM | POA: Diagnosis not present

## 2020-09-16 DIAGNOSIS — Z1329 Encounter for screening for other suspected endocrine disorder: Secondary | ICD-10-CM

## 2020-09-16 DIAGNOSIS — Z0001 Encounter for general adult medical examination with abnormal findings: Secondary | ICD-10-CM

## 2020-09-16 LAB — CBC
HCT: 45.3 % (ref 36.0–46.0)
Hemoglobin: 15.8 g/dL — ABNORMAL HIGH (ref 12.0–15.0)
MCHC: 35 g/dL (ref 30.0–36.0)
MCV: 91.8 fl (ref 78.0–100.0)
Platelets: 233 10*3/uL (ref 150.0–400.0)
RBC: 4.93 Mil/uL (ref 3.87–5.11)
RDW: 12.4 % (ref 11.5–15.5)
WBC: 4 10*3/uL (ref 4.0–10.5)

## 2020-09-16 LAB — COMPREHENSIVE METABOLIC PANEL
ALT: 13 U/L (ref 0–35)
AST: 21 U/L (ref 0–37)
Albumin: 4.7 g/dL (ref 3.5–5.2)
Alkaline Phosphatase: 79 U/L (ref 39–117)
BUN: 21 mg/dL (ref 6–23)
CO2: 38 mEq/L — ABNORMAL HIGH (ref 19–32)
Calcium: 10.7 mg/dL — ABNORMAL HIGH (ref 8.4–10.5)
Chloride: 96 mEq/L (ref 96–112)
Creatinine, Ser: 1.12 mg/dL (ref 0.40–1.20)
GFR: 51.44 mL/min — ABNORMAL LOW (ref >60.00)
Glucose, Bld: 87 mg/dL (ref 70–99)
Potassium: 3.4 mEq/L — ABNORMAL LOW (ref 3.5–5.1)
Sodium: 141 mEq/L (ref 135–145)
Total Bilirubin: 0.6 mg/dL (ref 0.2–1.2)
Total Protein: 7 g/dL (ref 6.0–8.3)

## 2020-09-16 LAB — LIPID PANEL
Cholesterol: 224 mg/dL — ABNORMAL HIGH (ref 0–200)
HDL: 61.8 mg/dL (ref >39.00)
LDL Cholesterol: 129 mg/dL — ABNORMAL HIGH (ref 0–99)
NonHDL: 162.1
Total CHOL/HDL Ratio: 4
Triglycerides: 165 mg/dL — ABNORMAL HIGH (ref 0.0–149.0)
VLDL: 33 mg/dL (ref 0.0–40.0)

## 2020-09-16 LAB — FOLLICLE STIMULATING HORMONE: FSH: 200.4 m[IU]/mL

## 2020-09-16 LAB — TSH: TSH: 1.27 u[IU]/mL (ref 0.35–4.50)

## 2020-09-22 ENCOUNTER — Telehealth: Payer: Self-pay | Admitting: *Deleted

## 2020-09-22 NOTE — Telephone Encounter (Signed)
Please place future orders for lab appt.  

## 2020-09-22 NOTE — Telephone Encounter (Signed)
Ordered

## 2020-09-24 ENCOUNTER — Other Ambulatory Visit: Payer: Self-pay

## 2020-09-24 ENCOUNTER — Other Ambulatory Visit (INDEPENDENT_AMBULATORY_CARE_PROVIDER_SITE_OTHER): Payer: 59

## 2020-09-24 LAB — BASIC METABOLIC PANEL
BUN: 18 mg/dL (ref 6–23)
CO2: 34 mEq/L — ABNORMAL HIGH (ref 19–32)
Calcium: 10.5 mg/dL (ref 8.4–10.5)
Chloride: 96 mEq/L (ref 96–112)
Creatinine, Ser: 1.11 mg/dL (ref 0.40–1.20)
GFR: 51.97 mL/min — ABNORMAL LOW (ref >60.00)
Glucose, Bld: 94 mg/dL (ref 70–99)
Potassium: 4.1 mEq/L (ref 3.5–5.1)
Sodium: 138 mEq/L (ref 135–145)

## 2020-09-25 LAB — PARATHYROID HORMONE, INTACT (NO CA): PTH: 35 pg/mL (ref 14–64)

## 2020-09-28 ENCOUNTER — Other Ambulatory Visit: Payer: Self-pay | Admitting: Family Medicine

## 2020-10-02 ENCOUNTER — Ambulatory Visit (INDEPENDENT_AMBULATORY_CARE_PROVIDER_SITE_OTHER): Payer: 59 | Admitting: Family Medicine

## 2020-10-02 ENCOUNTER — Telehealth: Payer: Self-pay | Admitting: Family Medicine

## 2020-10-02 ENCOUNTER — Other Ambulatory Visit (HOSPITAL_COMMUNITY)
Admission: RE | Admit: 2020-10-02 | Discharge: 2020-10-02 | Disposition: A | Discharge status: Home / Self Care | Payer: 59 | Source: Ambulatory Visit | Attending: Family Medicine | Admitting: Family Medicine

## 2020-10-02 ENCOUNTER — Other Ambulatory Visit: Payer: Self-pay

## 2020-10-02 ENCOUNTER — Encounter: Payer: Self-pay | Admitting: Family Medicine

## 2020-10-02 VITALS — BP 120/80 | HR 75 | Temp 98.6°F | Ht 63.0 in | Wt 123.0 lb

## 2020-10-02 DIAGNOSIS — Z0001 Encounter for general adult medical examination with abnormal findings: Secondary | ICD-10-CM | POA: Diagnosis not present

## 2020-10-02 DIAGNOSIS — Z1231 Encounter for screening mammogram for malignant neoplasm of breast: Secondary | ICD-10-CM

## 2020-10-02 DIAGNOSIS — D582 Other hemoglobinopathies: Secondary | ICD-10-CM | POA: Diagnosis not present

## 2020-10-02 DIAGNOSIS — N182 Chronic kidney disease, stage 2 (mild): Secondary | ICD-10-CM | POA: Insufficient documentation

## 2020-10-02 DIAGNOSIS — Z124 Encounter for screening for malignant neoplasm of cervix: Secondary | ICD-10-CM | POA: Diagnosis not present

## 2020-10-02 DIAGNOSIS — Z1211 Encounter for screening for malignant neoplasm of colon: Secondary | ICD-10-CM

## 2020-10-02 DIAGNOSIS — N183 Chronic kidney disease, stage 3 unspecified: Secondary | ICD-10-CM | POA: Insufficient documentation

## 2020-10-02 DIAGNOSIS — K219 Gastro-esophageal reflux disease without esophagitis: Secondary | ICD-10-CM

## 2020-10-02 DIAGNOSIS — Z23 Encounter for immunization: Secondary | ICD-10-CM

## 2020-10-02 DIAGNOSIS — R519 Headache, unspecified: Secondary | ICD-10-CM

## 2020-10-02 DIAGNOSIS — N1831 Chronic kidney disease, stage 3a: Secondary | ICD-10-CM

## 2020-10-02 DIAGNOSIS — G8929 Other chronic pain: Secondary | ICD-10-CM

## 2020-10-02 DIAGNOSIS — Z1159 Encounter for screening for other viral diseases: Secondary | ICD-10-CM

## 2020-10-02 DIAGNOSIS — G44209 Tension-type headache, unspecified, not intractable: Secondary | ICD-10-CM

## 2020-10-02 MED ORDER — FAMOTIDINE 20 MG PO TABS: 20.0000 mg | ORAL_TABLET | Freq: Two times a day (BID) | ORAL | 1 refills | Status: DC

## 2020-10-02 NOTE — Telephone Encounter (Signed)
lft vm for pt to call ofc to sch [facility] 

## 2020-10-02 NOTE — Assessment & Plan Note (Signed)
Patient reports significant increase in frequency of headaches.  Given her age and increasing frequency of headaches will try to obtain an MRI to rule out underlying structural issues.  I suspect it is most likely from tension headaches though need to rule out underlying lesion.

## 2020-10-02 NOTE — Patient Instructions (Signed)
Nice to see you. Please continue with healthy diet and exercise. We will get an ultrasound of your kidneys.  We will also you complete a urine and bring it back to [place]. Lab you return for labs in about 3 weeks. We will start you on Pepcid for your reflux. We will see if we can get a scan of your head approved by your insurance to evaluate for cause of your headaches.

## 2020-10-02 NOTE — Progress Notes (Signed)
Marikay Alar, MD Phone: [phone]  Misty Fernandez is a 49 y.o. female who presents today for CPE.  Diet: Generally healthy, vegetarian Exercise: Exercises 3-4 times a week Pap smear: Due Colonoscopy: Due Mammogram: Due Family history-  Colon cancer: No  Breast cancer: No  Ovarian cancer: No Menses: Postmenopausal Vaccines-   Flu: Up-to-date  Tetanus: Given today  Shingles: Given today  COVID19: Up-to-date HIV screening: Up-to-date Hep C Screening: Due Tobacco use: Many years ago Alcohol use: Infrequently Illicit Drug use: No Dentist: Yes Ophthalmology: Yes  CKD stage III: Kidney function relatively stable over the last year.  She notes no foamy urine.  No blood in her urine.  No family history of kidney disease.  No family history of lupus.  She notes she does not drink enough water.  Takes NSAIDs less than once a week.  Elevated hemoglobin: Non-smoker.  She saw hematology previously.  They felt it was likely secondary though they were unable to determine a cause.  She has not followed up with them since 2018.  Headaches: Patient notes history of migraines in the past though over the last several months up to a year she has noted different headaches occurring multiple days a week.  Occasionally they are bitemporal and sometimes posterior head headaches.  She takes Excedrin Migraine and Tylenol Sinus to help with these though does not take those daily.  No numbness or weakness.  No vision changes.  Reflux: This is been an issue for a few months.  She drinks coffee and notes that is a trigger.  There are other foods that are triggers.  She notes no dysphagia.  No blood in her stool.  No abdominal pain.  No family history of gastric or esophageal cancer.  She has taken famotidine in the past with good benefit though mostly has just been taking Tums.  Active Ambulatory Problems    Diagnosis Date Noted  . Encounter for general adult medical examination with abnormal  findings 03/01/2016  . Abnormal uterine bleeding 03/01/2016  . Depression, major, single episode, moderate (HCC) 04/28/2017  . Elevated hemoglobin (HCC) 06/20/2017  . Chronic left-sided low back pain without sciatica 12/08/2017  . Abnormal mammogram 12/08/2017  . Right foot pain 05/02/2018  . Tension headache 05/29/2019  . Bilateral carpal tunnel syndrome 12/09/2019  . GERD (gastroesophageal reflux disease) 10/02/2020  . Chronic kidney disease (CKD), stage III (moderate) (HCC) 10/02/2020   Resolved Ambulatory Problems    Diagnosis Date Noted  . Coccyx pain 01/22/2016   Past Medical History:  Diagnosis Date  . Chickenpox   . Depression   . Headache   . Heart murmur   . High blood pressure   . Preeclampsia     Family History  Problem Relation Age of Onset  . Arthritis Other        Parent, Grandparent  . Heart disease Other        Parent, grandparent  . High blood pressure Other        Parent  . Stroke Other        Grandparent  . Diabetes Other        Parent  . Cancer Father   . Breast cancer Neg Hx     Social History   Socioeconomic History  . Marital status: Married    Spouse name: Not on file  . Number of children: Not on file  . Years of education: Not on file  . Highest education level: Not on file  Occupational  History  . Not on file  Tobacco Use  . Smoking status: Former Games developer  . Smokeless tobacco: Never Used  Substance and Sexual Activity  . Alcohol use: Yes    Alcohol/week: 1.0 standard drink    Types: 1 Standard drinks or equivalent per week  . Drug use: No  . Sexual activity: Not on file  Other Topics Concern  . Not on file  Social History Narrative  . Not on file   Social Determinants of Health   Financial Resource Strain:   . Difficulty of Paying Living Expenses: Not on file  Food Insecurity:   . Worried About Programme researcher, broadcasting/film/video in the Last Year: Not on file  . Ran Out of Food in the Last Year: Not on file  Transportation Needs:     . Lack of Transportation (Medical): Not on file  . Lack of Transportation (Non-Medical): Not on file  Physical Activity:   . Days of Exercise per Week: Not on file  . Minutes of Exercise per Session: Not on file  Stress:   . Feeling of Stress : Not on file  Social Connections:   . Frequency of Communication with Friends and Family: Not on file  . Frequency of Social Gatherings with Friends and Family: Not on file  . Attends Religious Services: Not on file  . Active Member of Clubs or Organizations: Not on file  . Attends Banker Meetings: Not on file  . Marital Status: Not on file  Intimate Partner Violence:   . Fear of Current or Ex-Partner: Not on file  . Emotionally Abused: Not on file  . Physically Abused: Not on file  . Sexually Abused: Not on file    ROS  General:  Negative for nexplained weight loss, fever Skin: Negative for new or changing mole, sore that won't heal HEENT: Negative for trouble hearing, trouble seeing, ringing in ears, mouth sores, hoarseness, change in voice, dysphagia. CV:  Negative for chest pain, dyspnea, edema, palpitations Resp: Negative for cough, dyspnea, hemoptysis GI: Negative for nausea, vomiting, diarrhea, constipation, abdominal pain, melena, hematochezia. GU: Negative for dysuria, incontinence, urinary hesitance, hematuria, vaginal or penile discharge, polyuria, sexual difficulty, lumps in testicle or breasts MSK: Negative for muscle cramps or aches, joint pain or swelling Neuro: Positive for headaches, negative for weakness, numbness, dizziness, passing out/fainting Psych: Negative for depression, anxiety, memory problems  Objective  Physical Exam Vitals:   10/02/20 0813  BP: 120/80  Pulse: 75  Temp: 98.6 F (37 C)  SpO2: 99%    BP Readings from Last 3 Encounters:  10/02/20 120/80  05/30/18 138/88  05/02/18 110/80   Wt Readings from Last 3 Encounters:  10/02/20 123 lb (55.8 kg)  12/09/19 120 lb (54.4 kg)   05/30/18 120 lb 8 oz (54.7 kg)    Physical Exam Constitutional:      General: She is not in acute distress.    Appearance: She is not diaphoretic.  HENT:     Head: Normocephalic and atraumatic.  Eyes:     Conjunctiva/sclera: Conjunctivae normal.     Pupils: Pupils are equal, round, and reactive to light.  Cardiovascular:     Rate and Rhythm: Normal rate and regular rhythm.     Heart sounds: Normal heart sounds.  Pulmonary:     Effort: Pulmonary effort is normal.     Breath sounds: Normal breath sounds.  Abdominal:     General: Bowel sounds are normal. There is no distension.  Palpations: Abdomen is soft.     Tenderness: There is no abdominal tenderness. There is no guarding or rebound.  Genitourinary:    Comments: Charlyne MomNina Gonzalez, CMA served as chaperone, normal external genitalia, normal vaginal mucosa, normal-appearing cervix, uterus is midline and freely mobile, no adnexal masses or tenderness, bilateral breast with no skin changes, nipple inversion, masses, or tenderness, no axillary masses bilaterally Musculoskeletal:     Right lower leg: No edema.     Left lower leg: No edema.  Skin:    General: Skin is warm and dry.  Neurological:     Mental Status: She is alert.     Comments: EOMI, PERRL, opens and closes eyes adequately, hearing intact to finger rub, sensation light touch intact bilateral face V1 through V3, shoulder shrug intact, 5/5 strength in bilateral biceps, triceps, grip, quads, hamstrings, plantar and dorsiflexion, sensation to light touch intact in bilateral UE and LE, normal gait  Psychiatric:        Mood and Affect: Mood normal.      Assessment/Plan:   Problem List Items Addressed This Visit    Chronic kidney disease (CKD), stage III (moderate) (HCC)    Noted on lab work.  No obvious cause.  She will bring a urine back for us to check for protein.  Will obtain renal ultrasound.  Discussed the potential to see nephrology.  She will also increase her  water intake.      Relevant Orders   US Renal   POCT urinalysis dipstick   Basic Metabolic Panel (BMET)   Elevated hemoglobin (HCC)    Patient had prior work-up for this with no obvious cause.  We will plan on rechecking again with labs in 3 weeks.  If trending up we could have her go back to see hematology.      Encounter for general adult medical examination with abnormal findings - Primary    Physical exam completed.  Continue diet and exercise.  Pap smear completed today.  Mammogram ordered.  Patient will call to schedule.  GI referral for colonoscopy.  Tetanus vaccine and Shingrix vaccine given today.  Hepatitis C screening will be obtained with upcoming labs.  Advised if patient develops postmenopausal bleeding she needs to let us know right away.      GERD (gastroesophageal reflux disease)    Patient with no red flag symptoms.  We will start her on famotidine twice daily.  Follow-up in 6 weeks regarding symptoms.      Relevant Medications   famotidine (PEPCID) 20 MG tablet   Tension headache    Patient reports significant increase in frequency of headaches.  Given her age and increasing frequency of headaches will try to obtain an MRI to rule out underlying structural issues.  I suspect it is most likely from tension headaches though need to rule out underlying lesion.       Other Visit Diagnoses    Colon cancer screening       Relevant Orders   Ambulatory referral to Gastroenterology   Cervical cancer screening       Relevant Orders   Cytology - PAP( [place])   Chronic nonintractable headache, unspecified headache type       Relevant Orders   MR Brain Wo Contrast   Need for shingles vaccine       Relevant Orders   Varicella-zoster vaccine IM (Shingrix) (Completed)   Need for tetanus booster       Relevant Orders   Td : Tetanus/diphtheria >  7yo Preservative  free (Completed)   Encounter for screening mammogram for malignant neoplasm of breast       Relevant Orders    MM 3D SCREEN BREAST BILATERAL   Need for hepatitis C screening test       Relevant Orders   Hepatitis C Antibody      This visit occurred during the SARS-CoV-2 public health emergency.  Safety protocols were in place, including screening questions prior to the visit, additional usage of staff PPE, and extensive cleaning of exam room while observing appropriate contact time as indicated for disinfecting solutions.    Marikay Alar, MD [facility]

## 2020-10-02 NOTE — Assessment & Plan Note (Signed)
Physical exam completed.  Continue diet and exercise.  Pap smear completed today.  Mammogram ordered.  Patient will call to schedule.  GI referral for colonoscopy.  Tetanus vaccine and Shingrix vaccine given today.  Hepatitis C screening will be obtained with upcoming labs.  Advised if patient develops postmenopausal bleeding she needs to let us know right away.

## 2020-10-02 NOTE — Assessment & Plan Note (Signed)
Noted on lab work.  No obvious cause.  She will bring a urine back for [place] to check for protein.  Will obtain renal ultrasound.  Discussed the potential to see nephrology.  She will also increase her water intake.

## 2020-10-02 NOTE — Assessment & Plan Note (Signed)
Patient with no red flag symptoms.  We will start her on famotidine twice daily.  Follow-up in 6 weeks regarding symptoms.

## 2020-10-02 NOTE — Assessment & Plan Note (Signed)
Patient had prior work-up for this with no obvious cause.  We will plan on rechecking again with labs in 3 weeks.  If trending up we could have her go back to see hematology.

## 2020-10-05 LAB — CYTOLOGY - PAP
Comment: NEGATIVE
Diagnosis: NEGATIVE
High risk HPV: NEGATIVE

## 2020-10-15 ENCOUNTER — Telehealth (INDEPENDENT_AMBULATORY_CARE_PROVIDER_SITE_OTHER): Payer: Self-pay | Admitting: Gastroenterology

## 2020-10-15 ENCOUNTER — Other Ambulatory Visit: Payer: Self-pay

## 2020-10-15 DIAGNOSIS — Z1211 Encounter for screening for malignant neoplasm of colon: Secondary | ICD-10-CM

## 2020-10-15 MED ORDER — NA SULFATE-K SULFATE-MG SULF 17.5-3.13-1.6 GM/177ML PO SOLN: 1.0000 | Freq: Once | ORAL | 0 refills | Status: AC

## 2020-10-15 NOTE — Progress Notes (Signed)
Gastroenterology Pre-Procedure Review  Request Date: Friday 12/04/20 Requesting Physician: Dr. Servando Snare  PATIENT REVIEW QUESTIONS: The patient responded to the following health history questions as indicated:    1. Are you having any GI issues? no 2. Do you have a personal history of Polyps? no 3. Do you have a family history of Colon Cancer or Polyps? no 4. Diabetes Mellitus? no 5. Joint replacements in the past 12 months?no 6. Major health problems in the past 3 months?no 7. Any artificial heart valves, MVP, or defibrillator?no    MEDICATIONS & ALLERGIES:    Patient reports the following regarding taking any anticoagulation/antiplatelet therapy:   Plavix, Coumadin, Eliquis, Xarelto, Lovenox, Pradaxa, Brilinta, or Effient? no Aspirin? no  Patient confirms/reports the following medications:  Current Outpatient Medications  Medication Sig Dispense Refill   buPROPion (WELLBUTRIN XL) 300 MG 24 hr tablet Take 1 tablet (300 mg total) by mouth daily. 90 tablet 2   famotidine (PEPCID) 20 MG tablet Take 1 tablet (20 mg total) by mouth 2 (two) times daily. 60 tablet 1   No current facility-administered medications for this visit.    Patient confirms/reports the following allergies:  No Known Allergies  No orders of the defined types were placed in this encounter.   AUTHORIZATION INFORMATION Primary Insurance: 1D#: Group #:  Secondary Insurance: 1D#: Group #:  SCHEDULE INFORMATION: Date: Friday 12/04/20 Time: Location:[facility]

## 2020-10-16 ENCOUNTER — Other Ambulatory Visit: Payer: Self-pay

## 2020-10-16 ENCOUNTER — Ambulatory Visit
Admission: RE | Admit: 2020-10-16 | Discharge: 2020-10-16 | Disposition: A | Discharge status: Home / Self Care | Payer: 59 | Source: Ambulatory Visit | Attending: Family Medicine | Admitting: Family Medicine

## 2020-10-16 DIAGNOSIS — N1831 Chronic kidney disease, stage 3a: Secondary | ICD-10-CM | POA: Insufficient documentation

## 2020-10-16 DIAGNOSIS — R519 Headache, unspecified: Secondary | ICD-10-CM | POA: Diagnosis not present

## 2020-10-16 DIAGNOSIS — G8929 Other chronic pain: Secondary | ICD-10-CM | POA: Insufficient documentation

## 2020-10-20 ENCOUNTER — Telehealth: Payer: Self-pay

## 2020-10-20 NOTE — Telephone Encounter (Signed)
LMTCB for results for renal US.

## 2020-10-22 ENCOUNTER — Telehealth: Payer: Self-pay

## 2020-10-22 ENCOUNTER — Other Ambulatory Visit: Payer: Self-pay | Admitting: Family Medicine

## 2020-10-22 DIAGNOSIS — N1831 Chronic kidney disease, stage 3a: Secondary | ICD-10-CM

## 2020-10-22 DIAGNOSIS — E785 Hyperlipidemia, unspecified: Secondary | ICD-10-CM

## 2020-10-22 NOTE — Telephone Encounter (Signed)
Patient requested a lipid panel to be added to labs.  Ivin Rosenbloom,cma

## 2020-10-23 ENCOUNTER — Other Ambulatory Visit: Payer: Self-pay

## 2020-10-23 ENCOUNTER — Other Ambulatory Visit (INDEPENDENT_AMBULATORY_CARE_PROVIDER_SITE_OTHER): Payer: 59

## 2020-10-23 ENCOUNTER — Encounter: Payer: Self-pay | Admitting: Family Medicine

## 2020-10-23 DIAGNOSIS — E785 Hyperlipidemia, unspecified: Secondary | ICD-10-CM

## 2020-10-23 DIAGNOSIS — N1831 Chronic kidney disease, stage 3a: Secondary | ICD-10-CM

## 2020-10-23 DIAGNOSIS — Z1159 Encounter for screening for other viral diseases: Secondary | ICD-10-CM

## 2020-10-23 LAB — LIPID PANEL
Cholesterol: 229 mg/dL — ABNORMAL HIGH (ref 0–200)
HDL: 68.5 mg/dL (ref >39.00)
LDL Cholesterol: 138 mg/dL — ABNORMAL HIGH (ref 0–99)
NonHDL: 160.63
Total CHOL/HDL Ratio: 3
Triglycerides: 111 mg/dL (ref 0.0–149.0)
VLDL: 22.2 mg/dL (ref 0.0–40.0)

## 2020-10-23 LAB — POCT URINALYSIS DIPSTICK
Bilirubin, UA: NEGATIVE
Blood, UA: NEGATIVE
Glucose, UA: NEGATIVE
Ketones, UA: NEGATIVE
Nitrite, UA: NEGATIVE
Protein, UA: NEGATIVE
Spec Grav, UA: 1.015 (ref 1.010–1.025)
Urobilinogen, UA: 0.2 E.U./dL
pH, UA: 8.5 — AB (ref 5.0–8.0)

## 2020-10-23 LAB — BASIC METABOLIC PANEL
BUN: 16 mg/dL (ref 6–23)
CO2: 34 mEq/L — ABNORMAL HIGH (ref 19–32)
Calcium: 10.5 mg/dL (ref 8.4–10.5)
Chloride: 97 mEq/L (ref 96–112)
Creatinine, Ser: 1.02 mg/dL (ref 0.40–1.20)
GFR: 64.21 mL/min (ref >60.00)
Glucose, Bld: 94 mg/dL (ref 70–99)
Potassium: 4 mEq/L (ref 3.5–5.1)
Sodium: 142 mEq/L (ref 135–145)

## 2020-10-23 MED ORDER — ROSUVASTATIN CALCIUM 20 MG PO TABS: 20.0000 mg | ORAL_TABLET | Freq: Every day | ORAL | 3 refills | Status: DC

## 2020-10-25 ENCOUNTER — Other Ambulatory Visit: Payer: Self-pay | Admitting: Family Medicine

## 2020-10-25 DIAGNOSIS — K219 Gastro-esophageal reflux disease without esophagitis: Secondary | ICD-10-CM

## 2020-10-26 LAB — HEPATITIS C ANTIBODY
Hepatitis C Ab: NONREACTIVE
SIGNAL TO CUT-OFF: 0.01 (ref <1.00)

## 2020-10-29 NOTE — Telephone Encounter (Signed)
Called and spoke to [place]. Scheduled a lab visit for 12/11/2020 at 9:30am.

## 2020-11-06 ENCOUNTER — Ambulatory Visit (INDEPENDENT_AMBULATORY_CARE_PROVIDER_SITE_OTHER): Payer: 59 | Admitting: Family Medicine

## 2020-11-06 ENCOUNTER — Encounter: Payer: Self-pay | Admitting: Family Medicine

## 2020-11-06 ENCOUNTER — Other Ambulatory Visit: Payer: Self-pay

## 2020-11-06 DIAGNOSIS — E785 Hyperlipidemia, unspecified: Secondary | ICD-10-CM | POA: Diagnosis not present

## 2020-11-06 DIAGNOSIS — K219 Gastro-esophageal reflux disease without esophagitis: Secondary | ICD-10-CM | POA: Diagnosis not present

## 2020-11-06 DIAGNOSIS — G44209 Tension-type headache, unspecified, not intractable: Secondary | ICD-10-CM | POA: Diagnosis not present

## 2020-11-06 DIAGNOSIS — E782 Mixed hyperlipidemia: Secondary | ICD-10-CM | POA: Insufficient documentation

## 2020-11-06 DIAGNOSIS — R030 Elevated blood-pressure reading, without diagnosis of hypertension: Secondary | ICD-10-CM | POA: Diagnosis not present

## 2020-11-06 MED ORDER — NORTRIPTYLINE HCL 10 MG PO CAPS: 10.0000 mg | ORAL_CAPSULE | Freq: Every day | ORAL | 2 refills | Status: DC

## 2020-11-06 NOTE — Assessment & Plan Note (Signed)
Encouraged to take her Crestor with food to see if that is beneficial. If not beneficial we could switch to Lipitor.

## 2020-11-06 NOTE — Progress Notes (Signed)
[name], MD Phone: [phone]  Misty Fernandez is a 49 y.o. female who presents today for f/u.  GERD:   Reflux symptoms: none if she takes the pepcid   Abd pain: no   Blood in stool: no  Dysphagia: no    Medication: taking pepcid  Chronic headaches: Continues to have tension type headaches. There more frequent. She had an MRI which not reveal a cause. Has discomfort in the back of her head and underneath her right thigh. No photophobia. Notes are not consistent with her history of migraines. She has cut down on over-the-counter headache treatments significantly.  Notes some nausea after taking a statin. She does occasionally take it with food though other times does not.  Social History   Tobacco Use  Smoking Status Former Smoker  Smokeless Tobacco Never Used     ROS see history of present illness  Objective  Physical Exam Vitals:   11/06/20 1623  BP: 140/80  Pulse: 78  Temp: 98.4 F (36.9 C)  SpO2: 99%    BP Readings from Last 3 Encounters:  11/06/20 140/80  10/02/20 120/80  05/30/18 138/88   Wt Readings from Last 3 Encounters:  11/06/20 121 lb 6.4 oz (55.1 kg)  10/02/20 123 lb (55.8 kg)  12/09/19 120 lb (54.4 kg)    Physical Exam Constitutional:      General: She is not in acute distress.    Appearance: She is not diaphoretic.  Cardiovascular:     Rate and Rhythm: Normal rate and regular rhythm.     Heart sounds: Normal heart sounds.  Pulmonary:     Effort: Pulmonary effort is normal.     Breath sounds: Normal breath sounds.  Abdominal:     General: Bowel sounds are normal. There is no distension.     Palpations: Abdomen is soft.     Tenderness: There is no abdominal tenderness. There is no guarding or rebound.  Skin:    General: Skin is warm and dry.  Neurological:     Mental Status: She is alert.     Comments: Moves all extremities equally. Rises from chair easily and gets onto the exam table on her own.      Assessment/Plan:  Please see individual problem list.  Problem List Items Addressed This Visit    Elevated BP without diagnosis of hypertension    Previously well controlled. We will see what her blood pressure is at her visit with the nephrologist and at our next visit. If consistently elevated will consider treatment for hypertension.      GERD (gastroesophageal reflux disease)    Well-controlled. Continue Pepcid 20 mg twice daily.      Relevant Medications   SUPREP BOWEL PREP KIT 17.5-3.13-1.6 GM/177ML SOLN   Hyperlipidemia    Encouraged to take her Crestor with food to see if that is beneficial. If not beneficial we could switch to Lipitor.      Tension headache    Chronic issues with this. Has infrequently. Will start on nortriptyline 10 mg once nightly. Discussed risk of drowsiness, constipation, dry mouth, and sexual dysfunction with this medication. She has no side effects or other side effects she will let us know. Follow-up in 2 months. If is not beneficial back several weeks she will let us know.      Relevant Medications   nortriptyline (PAMELOR) 10 MG capsule       This visit occurred during the SARS-CoV-2 public health emergency.  Safety protocols were in place,  including screening questions prior to the visit, additional usage of staff PPE, and extensive cleaning of exam room while observing appropriate contact time as indicated for disinfecting solutions.    [name], MD [place]

## 2020-11-06 NOTE — Patient Instructions (Signed)
Nice to see you. We will start you on nortriptyline for your headaches. If you do not notice a benefit within the next 1 to 2 weeks please let us know. If you notice any of the side effects that we discussed or other side effects please let us know as well.

## 2020-11-06 NOTE — Assessment & Plan Note (Signed)
Previously well controlled. We will see what her blood pressure is at her visit with the nephrologist and at our next visit. If consistently elevated will consider treatment for hypertension.

## 2020-11-06 NOTE — Assessment & Plan Note (Signed)
Chronic issues with this. Has infrequently. Will start on nortriptyline 10 mg once nightly. Discussed risk of drowsiness, constipation, dry mouth, and sexual dysfunction with this medication. She has no side effects or other side effects she will let us know. Follow-up in 2 months. If is not beneficial back several weeks she will let us know.

## 2020-11-06 NOTE — Assessment & Plan Note (Signed)
Well-controlled. Continue Pepcid 20 mg twice daily.

## 2020-11-18 ENCOUNTER — Other Ambulatory Visit: Payer: Self-pay | Admitting: Nephrology

## 2020-11-18 DIAGNOSIS — N182 Chronic kidney disease, stage 2 (mild): Secondary | ICD-10-CM

## 2020-11-20 ENCOUNTER — Other Ambulatory Visit: Payer: Self-pay | Admitting: Family Medicine

## 2020-11-20 DIAGNOSIS — K219 Gastro-esophageal reflux disease without esophagitis: Secondary | ICD-10-CM

## 2020-11-26 ENCOUNTER — Encounter: Payer: Self-pay | Admitting: Gastroenterology

## 2020-11-26 ENCOUNTER — Encounter: Payer: Self-pay | Admitting: Family Medicine

## 2020-12-02 ENCOUNTER — Other Ambulatory Visit: Payer: Self-pay

## 2020-12-02 ENCOUNTER — Other Ambulatory Visit
Admission: RE | Admit: 2020-12-02 | Discharge: 2020-12-02 | Disposition: A | Discharge status: Home / Self Care | Payer: 59 | Source: Ambulatory Visit | Attending: Gastroenterology | Admitting: Gastroenterology

## 2020-12-02 DIAGNOSIS — Z01812 Encounter for preprocedural laboratory examination: Secondary | ICD-10-CM | POA: Diagnosis not present

## 2020-12-02 DIAGNOSIS — Z20822 Contact with and (suspected) exposure to covid-19: Secondary | ICD-10-CM | POA: Insufficient documentation

## 2020-12-02 LAB — SARS CORONAVIRUS 2 (TAT 6-24 HRS): SARS Coronavirus 2: NEGATIVE

## 2020-12-04 ENCOUNTER — Ambulatory Visit
Admission: RE | Admit: 2020-12-04 | Discharge: 2020-12-04 | Disposition: A | Discharge status: Home / Self Care | Payer: 59 | Attending: Gastroenterology | Admitting: Gastroenterology

## 2020-12-04 ENCOUNTER — Ambulatory Visit: Payer: 59 | Admitting: Anesthesiology

## 2020-12-04 ENCOUNTER — Encounter: Payer: Self-pay | Admitting: Gastroenterology

## 2020-12-04 ENCOUNTER — Other Ambulatory Visit: Payer: Self-pay

## 2020-12-04 ENCOUNTER — Encounter
Admission: RE | Disposition: A | Discharge status: Home / Self Care | Payer: Self-pay | Source: Home / Self Care | Attending: Gastroenterology

## 2020-12-04 DIAGNOSIS — Z1211 Encounter for screening for malignant neoplasm of colon: Secondary | ICD-10-CM | POA: Diagnosis not present

## 2020-12-04 DIAGNOSIS — Z87891 Personal history of nicotine dependence: Secondary | ICD-10-CM | POA: Diagnosis not present

## 2020-12-04 DIAGNOSIS — Z79899 Other long term (current) drug therapy: Secondary | ICD-10-CM | POA: Insufficient documentation

## 2020-12-04 DIAGNOSIS — K640 First degree hemorrhoids: Secondary | ICD-10-CM | POA: Diagnosis not present

## 2020-12-04 HISTORY — DX: Nausea with vomiting, unspecified: R11.2

## 2020-12-04 HISTORY — DX: Abnormal results of kidney function studies: R94.4

## 2020-12-04 HISTORY — PX: COLONOSCOPY WITH PROPOFOL: SHX5780

## 2020-12-04 HISTORY — DX: Other specified postprocedural states: Z98.890

## 2020-12-04 SURGERY — COLONOSCOPY WITH PROPOFOL
Anesthesia: General | Site: Rectum

## 2020-12-04 MED ORDER — STERILE WATER FOR IRRIGATION IR SOLN
Status: DC | PRN
  Administered 2020-12-04: .05 mL

## 2020-12-04 MED ORDER — LACTATED RINGERS IV SOLN: INTRAVENOUS | Status: DC

## 2020-12-04 MED ORDER — PROPOFOL 10 MG/ML IV BOLUS
INTRAVENOUS | Status: DC | PRN
  Administered 2020-12-04 (×2): 50 mg via INTRAVENOUS
  Administered 2020-12-04: 100 mg via INTRAVENOUS

## 2020-12-04 MED ORDER — LIDOCAINE HCL (CARDIAC) PF 100 MG/5ML IV SOSY
PREFILLED_SYRINGE | INTRAVENOUS | Status: DC | PRN
  Administered 2020-12-04: 80 mg via INTRAVENOUS

## 2020-12-04 SURGICAL SUPPLY — 6 items
GOWN CVR UNV OPN BCK APRN NK (MISCELLANEOUS) ×2 IMPLANT
GOWN ISOL THUMB LOOP REG UNIV (MISCELLANEOUS) ×4
KIT PRC NS LF DISP ENDO (KITS) ×1 IMPLANT
KIT PROCEDURE OLYMPUS (KITS) ×2
MANIFOLD NEPTUNE II (INSTRUMENTS) ×2 IMPLANT
WATER STERILE IRR 250ML POUR (IV SOLUTION) ×2 IMPLANT

## 2020-12-04 NOTE — Op Note (Addendum)
[facility] Gastroenterology Patient Name: Misty Fernandez Procedure Date: 12/04/2020 8:47 AM MRN: [id number] Account #: [id number] Date of Birth: 1971/11/10 Admit Type: Outpatient Age: 49 Room: [facility] OR ROOM 01 Gender: Female Note Status: Supervisor Override Procedure:             Colonoscopy Indications:           Screening for colorectal malignant neoplasm Providers:             Midge Minium MD, MD Referring MD:          Yehuda Mao. Birdie Sons (Referring MD) Medicines:             Propofol per Anesthesia Complications:         No immediate complications. Procedure:             Pre-Anesthesia Assessment:                        - Prior to the procedure, a History and Physical was                         performed, and patient medications and allergies were                         reviewed. The patient's tolerance of previous                         anesthesia was also reviewed. The risks and benefits                         of the procedure and the sedation options and risks                         were discussed with the patient. All questions were                         answered, and informed consent was obtained. Prior                         Anticoagulants: The patient has taken no previous                         anticoagulant or antiplatelet agents. ASA Grade                         Assessment: II - A patient with mild systemic disease.                         After reviewing the risks and benefits, the patient                         was deemed in satisfactory condition to undergo the                         procedure.                        After obtaining informed consent, the colonoscope was  passed under direct vision. Throughout the procedure,                         the patient's blood pressure, pulse, and oxygen                         saturations were monitored continuously. The                         Colonoscope was introduced  through the anus and                         advanced to the the cecum, identified by appendiceal                         orifice and ileocecal valve. The colonoscopy was                         performed without difficulty. The patient tolerated                         the procedure well. The quality of the bowel                         preparation was excellent. Findings:      The perianal and digital rectal examinations were normal.      Non-bleeding internal hemorrhoids were found during retroflexion. The       hemorrhoids were Grade I (internal hemorrhoids that do not prolapse).      The exam was otherwise without abnormality. Impression:            - Non-bleeding internal hemorrhoids.                        - The examination was otherwise normal.                        - No specimens collected. Recommendation:        - Discharge patient to home.                        - Resume previous diet.                        - Continue present medications.                        - Repeat colonoscopy in 10 years for screening                         purposes.                        - unless any change in family history or lower GI                         problems. Procedure Code(s):     --- Professional ---                        [phone], Colonoscopy, flexible; diagnostic, including  collection of specimen(s) by brushing or washing, when                         performed (separate procedure) Diagnosis Code(s):     --- Professional ---                        Z12.11, Encounter for screening for malignant neoplasm                         of colon CPT copyright 2019 American Medical Association. All rights reserved. The codes documented in this report are preliminary and upon coder review may  be revised to meet current compliance requirements. Midge Minium MD, MD 12/04/2020 9:18:40 AM This report has been signed electronically. Number of Addenda: 0 Note Initiated On:  12/04/2020 8:47 AM Scope Withdrawal Time: 0 hours 8 minutes 48 seconds  Total Procedure Duration: 0 hours 15 minutes 35 seconds  Estimated Blood Loss:  Estimated blood loss: none. Estimated blood loss: none.      [facility]

## 2020-12-04 NOTE — H&P (Signed)
Misty Lame, MD [facility] [address] [place], [place] [address] Phone: [phone] Fax : [phone]  Primary Care Physician:  Leone Haven, MD Primary Gastroenterologist:  Dr. Allen Norris  Pre-Procedure History & Physical: HPI:  Misty Fernandez is a 49 y.o. female is here for a screening colonoscopy.   Past Medical History:  Diagnosis Date  . Abnormal renal function test   . Chickenpox   . Depression   . GERD (gastroesophageal reflux disease)   . Headache   . Heart murmur   . High blood pressure   . PONV (postoperative nausea and vomiting)   . Preeclampsia     Past Surgical History:  Procedure Laterality Date  . KIDNEY STONE SURGERY    . No surgical history    . UTERINE SEPTUM RESECTION  2008    Prior to Admission medications   Medication Sig Start Date End Date Taking? Authorizing Provider  buPROPion (WELLBUTRIN XL) 300 MG 24 hr tablet Take 1 tablet (300 mg total) by mouth daily. 02/11/20  Yes Leone Haven, MD  famotidine (PEPCID) 20 MG tablet TAKE 1 TABLET BY MOUTH TWICE A DAY 11/21/20  Yes Leone Haven, MD  nortriptyline (PAMELOR) 10 MG capsule Take 1 capsule (10 mg total) by mouth at bedtime. 11/06/20  Yes Leone Haven, MD  rosuvastatin (CRESTOR) 20 MG tablet Take 1 tablet (20 mg total) by mouth daily. 10/23/20  Yes Leone Haven, MD  SUPREP BOWEL PREP KIT 17.5-3.13-1.6 GM/177ML SOLN Take by mouth. 10/15/20   [provider]    Allergies as of 10/15/2020  . (No Known Allergies)    Family History  Problem Relation Age of Onset  . Arthritis Other        Parent, Grandparent  . Heart disease Other        Parent, grandparent  . High blood pressure Other        Parent  . Stroke Other        Grandparent  . Diabetes Other        Parent  . Cancer Father   . Breast cancer Neg Hx     Social History   Socioeconomic History  . Marital status: Married    Spouse name: Not on file  . Number of children: Not on file  .  Years of education: Not on file  . Highest education level: Not on file  Occupational History  . Not on file  Tobacco Use  . Smoking status: Former Research scientist (life sciences)  . Smokeless tobacco: Never Used  Substance and Sexual Activity  . Alcohol use: Yes    Alcohol/week: 1.0 standard drink    Types: 1 Standard drinks or equivalent per week  . Drug use: No  . Sexual activity: Not on file  Other Topics Concern  . Not on file  Social History Narrative  . Not on file   Social Determinants of Health   Financial Resource Strain: Not on file  Food Insecurity: Not on file  Transportation Needs: Not on file  Physical Activity: Not on file  Stress: Not on file  Social Connections: Not on file  Intimate Partner Violence: Not on file    Review of Systems: See HPI, otherwise negative ROS  Physical Exam: Ht 5' 2"  (1.575 m)   Wt 54.4 kg   BMI 21.95 kg/m  General:   Alert,  pleasant and cooperative in NAD Head:  Normocephalic and atraumatic. Neck:  Supple; no masses or thyromegaly. Lungs:  Clear  throughout to auscultation.    Heart:  Regular rate and rhythm. Abdomen:  Soft, nontender and nondistended. Normal bowel sounds, without guarding, and without rebound.   Neurologic:  Alert and  oriented x4;  grossly normal neurologically.  Impression/Plan: Misty Fernandez is now here to undergo a screening colonoscopy.  Risks, benefits, and alternatives regarding colonoscopy have been reviewed with the patient.  Questions have been answered.  All parties agreeable.

## 2020-12-04 NOTE — Transfer of Care (Signed)
Immediate Anesthesia Transfer of Care Note  Patient: Misty Fernandez  Procedure(s) Performed: COLONOSCOPY WITH PROPOFOL (N/A Rectum)  Patient Location: PACU  Anesthesia Type: General  Level of Consciousness: awake, alert  and patient cooperative  Airway and Oxygen Therapy: Patient Spontanous Breathing and Patient connected to supplemental oxygen  Post-op Assessment: Post-op Vital signs reviewed, Patient's Cardiovascular Status Stable, Respiratory Function Stable, Patent Airway and No signs of Nausea or vomiting  Post-op Vital Signs: Reviewed and stable  Complications: No complications documented.

## 2020-12-04 NOTE — Anesthesia Procedure Notes (Signed)
Procedure Name: MAC Date/Time: 12/04/2020 9:00 AM Performed by: Jeannene Patella, CRNA Pre-anesthesia Checklist: Patient identified, Emergency Drugs available, Suction available, Timeout performed and Patient being monitored Patient Re-evaluated:Patient Re-evaluated prior to induction Oxygen Delivery Method: Nasal cannula Placement Confirmation: positive ETCO2

## 2020-12-04 NOTE — Anesthesia Preprocedure Evaluation (Signed)
Anesthesia Evaluation  Patient identified by MRN, date of birth, ID band Patient awake    Reviewed: Allergy & Precautions, H&P , NPO status , Patient's Chart, lab work & pertinent test results  History of Anesthesia Complications (+) PONV  Airway Mallampati: II  TM Distance: >3 FB Neck ROM: full    Dental no notable dental hx.    Pulmonary former smoker,    Pulmonary exam normal breath sounds clear to auscultation       Cardiovascular hypertension, Normal cardiovascular exam Rhythm:regular Rate:Normal     Neuro/Psych PSYCHIATRIC DISORDERS    GI/Hepatic GERD  ,  Endo/Other    Renal/GU Renal disease     Musculoskeletal   Abdominal   Peds  Hematology   Anesthesia Other Findings   Reproductive/Obstetrics                             Anesthesia Physical Anesthesia Plan  ASA: II  Anesthesia Plan: General   Post-op Pain Management:    Induction: Intravenous  PONV Risk Score and Plan: 4 or greater and Treatment may vary due to age or medical condition, TIVA and Propofol infusion  Airway Management Planned: Natural Airway  Additional Equipment:   Intra-op Plan:   Post-operative Plan:   Informed Consent: I have reviewed the patients History and Physical, chart, labs and discussed the procedure including the risks, benefits and alternatives for the proposed anesthesia with the patient or authorized representative who has indicated his/her understanding and acceptance.     Dental Advisory Given  Plan Discussed with: CRNA  Anesthesia Plan Comments:         Anesthesia Quick Evaluation

## 2020-12-04 NOTE — Anesthesia Postprocedure Evaluation (Signed)
Anesthesia Post Note  Patient: Misty Fernandez  Procedure(s) Performed: COLONOSCOPY WITH PROPOFOL (N/A Rectum)     Patient location during evaluation: PACU Anesthesia Type: General Level of consciousness: awake and alert and oriented Pain management: satisfactory to patient Vital Signs Assessment: post-procedure vital signs reviewed and stable Respiratory status: spontaneous breathing, nonlabored ventilation and respiratory function stable Cardiovascular status: blood pressure returned to baseline and stable Postop Assessment: Adequate PO intake and No signs of nausea or vomiting Anesthetic complications: no   No complications documented.  Cherly Beach

## 2020-12-11 ENCOUNTER — Other Ambulatory Visit: Payer: Self-pay

## 2020-12-11 ENCOUNTER — Other Ambulatory Visit (INDEPENDENT_AMBULATORY_CARE_PROVIDER_SITE_OTHER): Payer: 59

## 2020-12-11 DIAGNOSIS — E785 Hyperlipidemia, unspecified: Secondary | ICD-10-CM

## 2020-12-11 LAB — HEPATIC FUNCTION PANEL
ALT: 16 U/L (ref 0–35)
AST: 22 U/L (ref 0–37)
Albumin: 4.7 g/dL (ref 3.5–5.2)
Alkaline Phosphatase: 81 U/L (ref 39–117)
Bilirubin, Direct: 0.1 mg/dL (ref 0.0–0.3)
Total Bilirubin: 0.7 mg/dL (ref 0.2–1.2)
Total Protein: 7 g/dL (ref 6.0–8.3)

## 2020-12-11 LAB — LDL CHOLESTEROL, DIRECT: Direct LDL: 85 mg/dL

## 2020-12-16 ENCOUNTER — Other Ambulatory Visit: Payer: Self-pay | Admitting: Family Medicine

## 2020-12-16 DIAGNOSIS — K219 Gastro-esophageal reflux disease without esophagitis: Secondary | ICD-10-CM

## 2020-12-31 ENCOUNTER — Other Ambulatory Visit: Payer: Self-pay

## 2020-12-31 ENCOUNTER — Ambulatory Visit
Admission: RE | Admit: 2020-12-31 | Discharge: 2020-12-31 | Disposition: A | Discharge status: Home / Self Care | Payer: 59 | Source: Ambulatory Visit | Attending: Family Medicine | Admitting: Family Medicine

## 2020-12-31 ENCOUNTER — Other Ambulatory Visit: Payer: Self-pay | Admitting: Family Medicine

## 2020-12-31 DIAGNOSIS — N6489 Other specified disorders of breast: Secondary | ICD-10-CM

## 2020-12-31 DIAGNOSIS — Z1231 Encounter for screening mammogram for malignant neoplasm of breast: Secondary | ICD-10-CM | POA: Insufficient documentation

## 2020-12-31 DIAGNOSIS — R928 Other abnormal and inconclusive findings on diagnostic imaging of breast: Secondary | ICD-10-CM

## 2021-01-06 ENCOUNTER — Ambulatory Visit: Payer: 59 | Admitting: Family Medicine

## 2021-01-19 ENCOUNTER — Ambulatory Visit
Admission: RE | Admit: 2021-01-19 | Discharge: 2021-01-19 | Disposition: A | Discharge status: Home / Self Care | Payer: 59 | Source: Ambulatory Visit | Attending: Family Medicine | Admitting: Family Medicine

## 2021-01-19 ENCOUNTER — Other Ambulatory Visit: Payer: Self-pay

## 2021-01-19 DIAGNOSIS — R928 Other abnormal and inconclusive findings on diagnostic imaging of breast: Secondary | ICD-10-CM | POA: Diagnosis not present

## 2021-01-19 DIAGNOSIS — N6489 Other specified disorders of breast: Secondary | ICD-10-CM

## 2021-01-20 ENCOUNTER — Ambulatory Visit: Payer: 59 | Admitting: Family Medicine

## 2021-02-01 ENCOUNTER — Other Ambulatory Visit: Payer: Self-pay

## 2021-02-03 ENCOUNTER — Other Ambulatory Visit: Payer: Self-pay

## 2021-02-03 ENCOUNTER — Ambulatory Visit (INDEPENDENT_AMBULATORY_CARE_PROVIDER_SITE_OTHER): Payer: 59 | Admitting: Family Medicine

## 2021-02-03 ENCOUNTER — Encounter: Payer: Self-pay | Admitting: Family Medicine

## 2021-02-03 VITALS — BP 140/90 | HR 77 | Temp 98.7°F | Ht 63.0 in | Wt 126.2 lb

## 2021-02-03 DIAGNOSIS — G44209 Tension-type headache, unspecified, not intractable: Secondary | ICD-10-CM | POA: Diagnosis not present

## 2021-02-03 DIAGNOSIS — E785 Hyperlipidemia, unspecified: Secondary | ICD-10-CM

## 2021-02-03 DIAGNOSIS — I10 Essential (primary) hypertension: Secondary | ICD-10-CM | POA: Diagnosis not present

## 2021-02-03 DIAGNOSIS — F321 Major depressive disorder, single episode, moderate: Secondary | ICD-10-CM

## 2021-02-03 MED ORDER — LOSARTAN POTASSIUM 50 MG PO TABS: 50.0000 mg | ORAL_TABLET | Freq: Every day | ORAL | 3 refills | Status: DC

## 2021-02-03 NOTE — Assessment & Plan Note (Signed)
Continue Crestor 20 mg daily. 

## 2021-02-03 NOTE — Assessment & Plan Note (Signed)
Much improved.  She will monitor with no medication.

## 2021-02-03 NOTE — Assessment & Plan Note (Signed)
Well-controlled.  She will continue Wellbutrin 300 mg daily.

## 2021-02-03 NOTE — Patient Instructions (Signed)
Nice to see you. We will start you on losartan for your blood pressure.  You will return in 1 week for labs.  I will see you back in 1 month.

## 2021-02-03 NOTE — Assessment & Plan Note (Signed)
>>  ASSESSMENT AND PLAN FOR DEPRESSION, MAJOR, SINGLE EPISODE, MODERATE (HCC) WRITTEN ON 02/03/2021  4:22 PM BY SONNENBERG, ERIC G, MD  Well-controlled.  She will continue Wellbutrin  300 mg daily.

## 2021-02-03 NOTE — Assessment & Plan Note (Signed)
Blood pressure has been elevated recently.  Previously borderline elevated.  Discussed goal blood pressure less than 130/80.  We will start her on losartan 50 mg once daily.  She will return in 1 week for labs.  EKG performed today.  She has recently seen nephrology for her kidney dysfunction.  She previously saw hematology for her polycythemia.

## 2021-02-03 NOTE — Progress Notes (Signed)
[name], MD Phone: [phone]  Misty Fernandez is a 50 y.o. female who presents today for f/u.  HYPERTENSION  Disease Monitoring  Home BP Monitoring similar to today recently Chest pain- no    Dyspnea- no Medications  Compliance-  Taking no medications. Reports a family history of hypertension in her mother and father. The patient is postmenopausal. She reports history of preeclampsia with pregnancy in the past.  Headaches: Much improved.  She never started on the nortriptyline.  She would prefer not to take any additional medication if possible.  Hyperlipidemia: Taking Crestor.  No chest pain.  No claudication.  No right upper quadrant pain or myalgias.  The nausea resolved with taking the Crestor with food.  Depression: Notes this is well managed with Wellbutrin.  Denies symptoms currently.    Social History   Tobacco Use  Smoking Status Former Smoker  Smokeless Tobacco Never Used    Current Outpatient Medications on File Prior to Visit  Medication Sig Dispense Refill  . buPROPion (WELLBUTRIN XL) 300 MG 24 hr tablet Take 1 tablet (300 mg total) by mouth daily. 90 tablet 2  . famotidine (PEPCID) 20 MG tablet TAKE 1 TABLET BY MOUTH TWICE A DAY 180 tablet 1  . rosuvastatin (CRESTOR) 20 MG tablet Take 1 tablet (20 mg total) by mouth daily. 90 tablet 3  . nortriptyline (PAMELOR) 10 MG capsule Take 1 capsule (10 mg total) by mouth at bedtime. (Patient not taking: Reported on 02/03/2021) 30 capsule 2  . SUPREP BOWEL PREP KIT 17.5-3.13-1.6 GM/177ML SOLN Take by mouth. (Patient not taking: Reported on 02/03/2021)     No current facility-administered medications on file prior to visit.     ROS see history of present illness  Objective  Physical Exam Vitals:   02/03/21 1600  BP: (!) 150/90  Pulse: 77  Temp: 98.7 F (37.1 C)  SpO2: 99%    BP Readings from Last 3 Encounters:  02/03/21 (!) 150/90  12/04/20 117/80  11/06/20 140/80   Wt Readings from Last 3  Encounters:  02/03/21 126 lb 3.2 oz (57.2 kg)  12/04/20 122 lb 11.2 oz (55.7 kg)  11/06/20 121 lb 6.4 oz (55.1 kg)    Physical Exam Constitutional:      General: She is not in acute distress.    Appearance: She is not diaphoretic.  Cardiovascular:     Rate and Rhythm: Normal rate and regular rhythm.     Heart sounds: Normal heart sounds.     Comments: No abdominal bruits, 2+ radial pulses bilaterally Pulmonary:     Effort: Pulmonary effort is normal.     Breath sounds: Normal breath sounds.  Musculoskeletal:        General: No edema.     Right lower leg: No edema.     Left lower leg: No edema.  Skin:    General: Skin is warm and dry.  Neurological:     Mental Status: She is alert.    EKG: Normal sinus rhythm, rate 76, possible left atrial enlargement, incomplete right bundle branch block, no ischemic changes noted  Note from primary care provider in 2008 reviewed with EKG noting possible left and right atrial enlargement.  Assessment/Plan: Please see individual problem list.  Problem List Items Addressed This Visit    Depression, major, single episode, moderate ([place])    Well-controlled.  She will continue Wellbutrin 300 mg daily.      Hyperlipidemia    Continue Crestor 20 mg daily.  Relevant Medications   losartan (COZAAR) 50 MG tablet   Hypertension - Primary    Blood pressure has been elevated recently.  Previously borderline elevated.  Discussed goal blood pressure less than 130/80.  We will start her on losartan 50 mg once daily.  She will return in 1 week for labs.  EKG performed today.  She has recently seen nephrology for her kidney dysfunction.  She previously saw hematology for her polycythemia.      Relevant Medications   losartan (COZAAR) 50 MG tablet   Other Relevant Orders   EKG [id number]   Basic Metabolic Panel (BMET)   Tension headache    Much improved.  She will monitor with no medication.        Follow-up in 1 month for her blood  pressure.  This visit occurred during the SARS-CoV-2 public health emergency.  Safety protocols were in place, including screening questions prior to the visit, additional usage of staff PPE, and extensive cleaning of exam room while observing appropriate contact time as indicated for disinfecting solutions.    [name], MD [facility]

## 2021-02-10 ENCOUNTER — Other Ambulatory Visit (INDEPENDENT_AMBULATORY_CARE_PROVIDER_SITE_OTHER): Payer: 59

## 2021-02-10 ENCOUNTER — Other Ambulatory Visit: Payer: Self-pay

## 2021-02-10 DIAGNOSIS — I10 Essential (primary) hypertension: Secondary | ICD-10-CM | POA: Diagnosis not present

## 2021-02-10 LAB — BASIC METABOLIC PANEL
BUN: 20 mg/dL (ref 6–23)
CO2: 35 mEq/L — ABNORMAL HIGH (ref 19–32)
Calcium: 10.5 mg/dL (ref 8.4–10.5)
Chloride: 101 mEq/L (ref 96–112)
Creatinine, Ser: 1.04 mg/dL (ref 0.40–1.20)
GFR: 62.6 mL/min (ref >60.00)
Glucose, Bld: 95 mg/dL (ref 70–99)
Potassium: 3.9 mEq/L (ref 3.5–5.1)
Sodium: 141 mEq/L (ref 135–145)

## 2021-02-24 ENCOUNTER — Other Ambulatory Visit: Payer: Self-pay | Admitting: Family Medicine

## 2021-03-05 ENCOUNTER — Encounter: Payer: Self-pay | Admitting: Family Medicine

## 2021-03-05 MED ORDER — BUPROPION HCL ER (XL) 300 MG PO TB24: ORAL_TABLET | ORAL | 11 refills | Status: DC

## 2021-03-08 MED ORDER — BUPROPION HCL ER (XL) 300 MG PO TB24: ORAL_TABLET | ORAL | 11 refills | Status: DC

## 2021-03-08 NOTE — Addendum Note (Signed)
Addended by: Tilford Pillar on: 03/08/2021 07:32 AM   Modules accepted: Orders

## 2021-03-23 ENCOUNTER — Encounter: Payer: Self-pay | Admitting: Family Medicine

## 2021-03-23 ENCOUNTER — Other Ambulatory Visit: Payer: Self-pay

## 2021-03-23 ENCOUNTER — Telehealth (INDEPENDENT_AMBULATORY_CARE_PROVIDER_SITE_OTHER): Payer: 59 | Admitting: Family Medicine

## 2021-03-23 DIAGNOSIS — I10 Essential (primary) hypertension: Secondary | ICD-10-CM

## 2021-03-23 NOTE — Progress Notes (Signed)
Virtual Visit via  Note  This visit type was conducted due to national recommendations for restrictions regarding the COVID-19 pandemic (e.g. social distancing).  This format is felt to be most appropriate for this patient at this time.  All issues noted in this document were discussed and addressed.  No physical exam was performed (except for noted visual exam findings with Video Visits).   I connected with Leitha Bleak today at  8:30 AM EDT by a video enabled telemedicine application or telephone and verified that I am speaking with the correct person using two identifiers. Location patient: work Location provider: work  Persons participating in the virtual visit: patient, provider  I discussed the limitations, risks, security and privacy concerns of performing an evaluation and management service by telephone and the availability of in person appointments. I also discussed with the patient that there may be a patient responsible charge related to this service. The patient expressed understanding and agreed to proceed.  Reason for visit: f/u.  HPI: HYPERTENSION  Disease Monitoring  Home BP Monitoring notes the app she is using has her in the light green range which is normal though not optimal Chest pain- no    Dyspnea- no Medications  Compliance-  Taking losartan.  Edema- no No side effects noted with starting the medication.      ROS: See pertinent positives and negatives per HPI.  Past Medical History:  Diagnosis Date  . Abnormal renal function test   . Chickenpox   . Depression   . GERD (gastroesophageal reflux disease)   . Headache   . Heart murmur   . High blood pressure   . PONV (postoperative nausea and vomiting)   . Preeclampsia     Past Surgical History:  Procedure Laterality Date  . COLONOSCOPY WITH PROPOFOL N/A 12/04/2020   Procedure: COLONOSCOPY WITH PROPOFOL;  Surgeon: Lucilla Lame, MD;  Location: [facility];  Service: Endoscopy;  Laterality:  N/A;  priority 4  . KIDNEY STONE SURGERY    . No surgical history    . UTERINE SEPTUM RESECTION  2008    Family History  Problem Relation Age of Onset  . Arthritis Other        Parent, Grandparent  . Heart disease Other        Parent, grandparent  . High blood pressure Other        Parent  . Stroke Other        Grandparent  . Diabetes Other        Parent  . Cancer Father   . Breast cancer Neg Hx     SOCIAL HX: former smoker   Current Outpatient Medications:  .  buPROPion (WELLBUTRIN XL) 300 MG 24 hr tablet, TAKE ONE TABLET BY MOUTH DAILY ([facility]), Disp: 30 tablet, Rfl: 11 .  famotidine (PEPCID) 20 MG tablet, TAKE 1 TABLET BY MOUTH TWICE A DAY, Disp: 180 tablet, Rfl: 1 .  losartan (COZAAR) 50 MG tablet, Take 1 tablet (50 mg total) by mouth daily., Disp: 90 tablet, Rfl: 3 .  nortriptyline (PAMELOR) 10 MG capsule, Take 1 capsule (10 mg total) by mouth at bedtime., Disp: 30 capsule, Rfl: 2 .  rosuvastatin (CRESTOR) 20 MG tablet, Take 1 tablet (20 mg total) by mouth daily., Disp: 90 tablet, Rfl: 3 .  SUPREP BOWEL PREP KIT 17.5-3.13-1.6 GM/177ML SOLN, Take by mouth., Disp: , Rfl:   EXAM:  VITALS per patient if applicable:  GENERAL: alert, oriented, appears well and in  no acute distress  HEENT: atraumatic, conjunttiva clear, no obvious abnormalities on inspection of external nose and ears  NECK: normal movements of the head and neck  LUNGS: on inspection no signs of respiratory distress, breathing rate appears normal, no obvious gross SOB, gasping or wheezing  CV: no obvious cyanosis  MS: moves all visible extremities without noticeable abnormality  PSYCH/NEURO: pleasant and cooperative, no obvious depression or anxiety, speech and thought processing grossly intact  ASSESSMENT AND PLAN:  Discussed the following assessment and plan:  Problem List Items Addressed This Visit    Hypertension    Seems to be well controlled.  She will send me her readings through my  chart to confirm this.  We will continue losartan 50 mg once daily.  She will follow up in 3 months.          I discussed the assessment and treatment plan with the patient. The patient was provided an opportunity to ask questions and all were answered. The patient agreed with the plan and demonstrated an understanding of the instructions.   The patient was advised to call back or seek an in-person evaluation if the symptoms worsen or if the condition fails to improve as anticipated.   [name], MD

## 2021-03-23 NOTE — Assessment & Plan Note (Signed)
Seems to be well controlled.  She will send me her readings through my chart to confirm this.  We will continue losartan 50 mg once daily.  She will follow up in 3 months.

## 2021-03-26 MED ORDER — LOSARTAN POTASSIUM 100 MG PO TABS: 100.0000 mg | ORAL_TABLET | Freq: Every day | ORAL | 1 refills | Status: DC

## 2021-04-06 ENCOUNTER — Other Ambulatory Visit: Payer: Self-pay | Admitting: Family Medicine

## 2021-04-06 ENCOUNTER — Other Ambulatory Visit (INDEPENDENT_AMBULATORY_CARE_PROVIDER_SITE_OTHER): Payer: 59

## 2021-04-06 ENCOUNTER — Other Ambulatory Visit: Payer: Self-pay

## 2021-04-06 DIAGNOSIS — R809 Proteinuria, unspecified: Secondary | ICD-10-CM

## 2021-04-06 DIAGNOSIS — I10 Essential (primary) hypertension: Secondary | ICD-10-CM | POA: Diagnosis not present

## 2021-04-06 LAB — POCT URINALYSIS DIPSTICK
Bilirubin, UA: NEGATIVE
Blood, UA: NEGATIVE
Glucose, UA: NEGATIVE
Ketones, UA: NEGATIVE
Leukocytes, UA: NEGATIVE
Nitrite, UA: NEGATIVE
Protein, UA: POSITIVE — AB
Spec Grav, UA: 1.02 (ref 1.010–1.025)
Urobilinogen, UA: 0.2 E.U./dL
pH, UA: 8.5 — AB (ref 5.0–8.0)

## 2021-04-06 LAB — BASIC METABOLIC PANEL
BUN: 18 mg/dL (ref 6–23)
CO2: 31 mEq/L (ref 19–32)
Calcium: 10.7 mg/dL — ABNORMAL HIGH (ref 8.4–10.5)
Chloride: 98 mEq/L (ref 96–112)
Creatinine, Ser: 1.1 mg/dL (ref 0.40–1.20)
GFR: 58.46 mL/min — ABNORMAL LOW (ref >60.00)
Glucose, Bld: 109 mg/dL — ABNORMAL HIGH (ref 70–99)
Potassium: 3.7 mEq/L (ref 3.5–5.1)
Sodium: 140 mEq/L (ref 135–145)

## 2021-04-06 LAB — CBC
HCT: 45.1 % (ref 36.0–46.0)
Hemoglobin: 15.5 g/dL — ABNORMAL HIGH (ref 12.0–15.0)
MCHC: 34.4 g/dL (ref 30.0–36.0)
MCV: 91 fl (ref 78.0–100.0)
Platelets: 253 10*3/uL (ref 150.0–400.0)
RBC: 4.96 Mil/uL (ref 3.87–5.11)
RDW: 12.7 % (ref 11.5–15.5)
WBC: 4.6 10*3/uL (ref 4.0–10.5)

## 2021-04-06 LAB — TSH: TSH: 1.03 u[IU]/mL (ref 0.35–4.50)

## 2021-04-06 NOTE — Addendum Note (Signed)
Addended by: Hulan Fray on: 04/06/2021 04:13 PM   Modules accepted: Orders

## 2021-04-07 LAB — PROTEIN / CREATININE RATIO, URINE
Creatinine, Urine: 177 mg/dL (ref 20–275)
Protein/Creat Ratio: 62 mg/g creat (ref 21–161)
Protein/Creatinine Ratio: 0.062 mg/mg creat (ref 0.021–0.16)
Total Protein, Urine: 11 mg/dL (ref 5–24)

## 2021-04-19 ENCOUNTER — Other Ambulatory Visit: Payer: Self-pay | Admitting: Family Medicine

## 2021-04-19 DIAGNOSIS — K219 Gastro-esophageal reflux disease without esophagitis: Secondary | ICD-10-CM

## 2021-04-28 ENCOUNTER — Encounter: Payer: Self-pay | Admitting: Family Medicine

## 2021-09-22 ENCOUNTER — Other Ambulatory Visit: Payer: Self-pay | Admitting: Family Medicine

## 2021-09-22 DIAGNOSIS — I10 Essential (primary) hypertension: Secondary | ICD-10-CM

## 2021-10-10 ENCOUNTER — Other Ambulatory Visit: Payer: Self-pay | Admitting: Family Medicine

## 2021-10-10 DIAGNOSIS — K219 Gastro-esophageal reflux disease without esophagitis: Secondary | ICD-10-CM

## 2021-10-20 ENCOUNTER — Other Ambulatory Visit: Payer: Self-pay | Admitting: Family Medicine

## 2021-10-20 DIAGNOSIS — E785 Hyperlipidemia, unspecified: Secondary | ICD-10-CM

## 2021-12-22 ENCOUNTER — Other Ambulatory Visit: Payer: Self-pay | Admitting: Family Medicine

## 2021-12-22 DIAGNOSIS — I10 Essential (primary) hypertension: Secondary | ICD-10-CM

## 2021-12-27 ENCOUNTER — Other Ambulatory Visit: Payer: Self-pay | Admitting: Family Medicine

## 2021-12-27 DIAGNOSIS — I10 Essential (primary) hypertension: Secondary | ICD-10-CM

## 2022-02-28 ENCOUNTER — Ambulatory Visit: Payer: 59 | Admitting: Family Medicine

## 2022-03-23 ENCOUNTER — Other Ambulatory Visit: Payer: Self-pay | Admitting: Family Medicine

## 2022-03-23 ENCOUNTER — Encounter: Payer: 59 | Admitting: Family Medicine

## 2022-03-23 DIAGNOSIS — I10 Essential (primary) hypertension: Secondary | ICD-10-CM

## 2022-04-05 ENCOUNTER — Encounter: Payer: 59 | Admitting: Family Medicine

## 2022-04-16 ENCOUNTER — Other Ambulatory Visit: Payer: Self-pay | Admitting: Family Medicine

## 2022-04-16 DIAGNOSIS — I10 Essential (primary) hypertension: Secondary | ICD-10-CM

## 2022-04-22 ENCOUNTER — Other Ambulatory Visit: Payer: Self-pay | Admitting: Family Medicine

## 2022-04-22 DIAGNOSIS — E785 Hyperlipidemia, unspecified: Secondary | ICD-10-CM

## 2022-04-29 ENCOUNTER — Encounter: Payer: Self-pay | Admitting: Family Medicine

## 2022-04-29 ENCOUNTER — Ambulatory Visit (INDEPENDENT_AMBULATORY_CARE_PROVIDER_SITE_OTHER): Payer: 59 | Admitting: Family Medicine

## 2022-04-29 VITALS — BP 105/70 | HR 77 | Temp 98.2°F | Ht 63.0 in | Wt 126.4 lb

## 2022-04-29 DIAGNOSIS — Z0000 Encounter for general adult medical examination without abnormal findings: Secondary | ICD-10-CM | POA: Diagnosis not present

## 2022-04-29 DIAGNOSIS — Z23 Encounter for immunization: Secondary | ICD-10-CM | POA: Diagnosis not present

## 2022-04-29 DIAGNOSIS — Z130 Encounter for screening for diseases of the blood and blood-forming organs and certain disorders involving the immune mechanism: Secondary | ICD-10-CM | POA: Diagnosis not present

## 2022-04-29 DIAGNOSIS — Z1329 Encounter for screening for other suspected endocrine disorder: Secondary | ICD-10-CM

## 2022-04-29 DIAGNOSIS — K219 Gastro-esophageal reflux disease without esophagitis: Secondary | ICD-10-CM

## 2022-04-29 DIAGNOSIS — Z1231 Encounter for screening mammogram for malignant neoplasm of breast: Secondary | ICD-10-CM

## 2022-04-29 DIAGNOSIS — E785 Hyperlipidemia, unspecified: Secondary | ICD-10-CM | POA: Diagnosis not present

## 2022-04-29 LAB — COMPREHENSIVE METABOLIC PANEL
ALT: 19 U/L (ref 0–35)
AST: 26 U/L (ref 0–37)
Albumin: 4.6 g/dL (ref 3.5–5.2)
Alkaline Phosphatase: 70 U/L (ref 39–117)
BUN: 17 mg/dL (ref 6–23)
CO2: 34 mEq/L — ABNORMAL HIGH (ref 19–32)
Calcium: 10 mg/dL (ref 8.4–10.5)
Chloride: 99 mEq/L (ref 96–112)
Creatinine, Ser: 1.04 mg/dL (ref 0.40–1.20)
GFR: 62.06 mL/min (ref >60.00)
Glucose, Bld: 117 mg/dL — ABNORMAL HIGH (ref 70–99)
Potassium: 3.5 mEq/L (ref 3.5–5.1)
Sodium: 142 mEq/L (ref 135–145)
Total Bilirubin: 0.6 mg/dL (ref 0.2–1.2)
Total Protein: 7.2 g/dL (ref 6.0–8.3)

## 2022-04-29 LAB — CBC WITH DIFFERENTIAL/PLATELET
Basophils Absolute: 0 10*3/uL (ref 0.0–0.1)
Basophils Relative: 0.7 % (ref 0.0–3.0)
Eosinophils Absolute: 0.1 10*3/uL (ref 0.0–0.7)
Eosinophils Relative: 1.1 % (ref 0.0–5.0)
HCT: 44 % (ref 36.0–46.0)
Hemoglobin: 15 g/dL (ref 12.0–15.0)
Lymphocytes Relative: 18.4 % (ref 12.0–46.0)
Lymphs Abs: 1 10*3/uL (ref 0.7–4.0)
MCHC: 34.1 g/dL (ref 30.0–36.0)
MCV: 92.4 fl (ref 78.0–100.0)
Monocytes Absolute: 0.4 10*3/uL (ref 0.1–1.0)
Monocytes Relative: 6.5 % (ref 3.0–12.0)
Neutro Abs: 4.1 10*3/uL (ref 1.4–7.7)
Neutrophils Relative %: 73.3 % (ref 43.0–77.0)
Platelets: 234 10*3/uL (ref 150.0–400.0)
RBC: 4.76 Mil/uL (ref 3.87–5.11)
RDW: 12.6 % (ref 11.5–15.5)
WBC: 5.6 10*3/uL (ref 4.0–10.5)

## 2022-04-29 LAB — LIPID PANEL
Cholesterol: 148 mg/dL (ref 0–200)
HDL: 65.7 mg/dL (ref >39.00)
LDL Cholesterol: 68 mg/dL (ref 0–99)
NonHDL: 82.69
Total CHOL/HDL Ratio: 2
Triglycerides: 75 mg/dL (ref 0.0–149.0)
VLDL: 15 mg/dL (ref 0.0–40.0)

## 2022-04-29 LAB — TSH: TSH: 1.16 u[IU]/mL (ref 0.35–5.50)

## 2022-04-29 MED ORDER — FAMOTIDINE 20 MG PO TABS: 20.0000 mg | ORAL_TABLET | Freq: Two times a day (BID) | ORAL | 11 refills | Status: DC

## 2022-04-29 NOTE — Patient Instructions (Signed)
Nice to see you. ?Please let us know if you have any persistent reactions to the shingles vaccine. ?Please call [phone] to schedule your mammogram. ?We will contact you with your lab results. ?Please work on reducing your soda and sweet intake. ?

## 2022-04-29 NOTE — Progress Notes (Signed)
?Misty Rumps, MD ?Phone: [phone] ? ?Misty Fernandez is a 51 y.o. female who presents today for CPE. ? ?Diet: normal, plenty of fruits and vegetables, 1-2 sodas/day, sweets most days, some fried foods ?Exercise: crossfit type exercise 3x/week ?Pap smear: 10/02/20 NILM neg HPV ?Colonoscopy: 12/04/20 10 year recall ?Mammogram: due ?Family history- ? Colon cancer: no ? Breast cancer: no ? Ovarian cancer: no ?Menses: postmenopausal, no bleeding ?Vaccines-  ? Flu: UTD ? Tetanus: UTD ?Shingles:  due for second shot, reports feeling feverish for a few days after her initial vaccine, also had local reaction in right arm ? COVID19: x3, UTD ?HIV screening: UTD ?Hep C Screening: UTD ?Tobacco use: no ?Alcohol use: occasionally has 1-2 beverages ?Illicit Drug use: no ?Dentist: yes ?Ophthalmology: yes ? ? ?Active Ambulatory Problems  ?  Diagnosis Date Noted  ? Routine general medical examination at a health care facility 03/01/2016  ? Abnormal uterine bleeding 03/01/2016  ? Depression, major, single episode, moderate ([place]) 04/28/2017  ? Elevated hemoglobin ([place]) 06/20/2017  ? Chronic left-sided low back pain without sciatica 12/08/2017  ? Abnormal mammogram 12/08/2017  ? Tension headache 05/29/2019  ? Carpal tunnel syndrome, bilateral upper limbs 12/09/2019  ? GERD (gastroesophageal reflux disease) 10/02/2020  ? Chronic kidney disease (CKD), stage III (moderate) ([place]) 10/02/2020  ? Numbness and tingling in both hands 02/05/2020  ? Hyperlipidemia 11/06/2020  ? Hypertension 02/03/2021  ? ?Resolved Ambulatory Problems  ?  Diagnosis Date Noted  ? Coccyx pain 01/22/2016  ? Right foot pain 05/02/2018  ? Elevated BP without diagnosis of hypertension 11/06/2020  ? Special screening for malignant neoplasms, colon   ? ?Past Medical History:  ?Diagnosis Date  ? Abnormal renal function test   ? Chickenpox   ? Depression   ? Headache   ? Heart murmur   ? High blood pressure   ? PONV (postoperative nausea and vomiting)   ?  Preeclampsia   ? ? ?Family History  ?Problem Relation Age of Onset  ? Arthritis Other   ?     Parent, Grandparent  ? Heart disease Other   ?     Parent, grandparent  ? High blood pressure Other   ?     Parent  ? Stroke Other   ?     Grandparent  ? Diabetes Other   ?     Parent  ? Cancer Father   ? Breast cancer Neg Hx   ? ? ?Social History  ? ?Socioeconomic History  ? Marital status: Married  ?  Spouse name: Not on file  ? Number of children: Not on file  ? Years of education: Not on file  ? Highest education level: Not on file  ?Occupational History  ? Not on file  ?Tobacco Use  ? Smoking status: Former  ? Smokeless tobacco: Never  ?Substance and Sexual Activity  ? Alcohol use: Yes  ?  Alcohol/week: 1.0 standard drink  ?  Types: 1 Standard drinks or equivalent per week  ? Drug use: No  ? Sexual activity: Not on file  ?Other Topics Concern  ? Not on file  ?Social History Narrative  ? Not on file  ? ?Social Determinants of Health  ? ?Financial Resource Strain: Not on file  ?Food Insecurity: Not on file  ?Transportation Needs: Not on file  ?Physical Activity: Not on file  ?Stress: Not on file  ?Social Connections: Not on file  ?Intimate Partner Violence: Not on file  ? ? ?ROS ? ?General:  Negative for nexplained weight loss, fever ?Skin: Negative for new or changing mole, sore that won't heal ?HEENT: Negative for trouble hearing, trouble seeing, ringing in ears, mouth sores, hoarseness, change in voice, dysphagia. ?CV:  Negative for chest pain, dyspnea, edema, palpitations ?Resp: Negative for cough, dyspnea, hemoptysis ?GI: Negative for nausea, vomiting, diarrhea, constipation, abdominal pain, melena, hematochezia. ?GU: Negative for dysuria, incontinence, urinary hesitance, hematuria, vaginal or penile discharge, polyuria, sexual difficulty, lumps in testicle or breasts ?MSK: Negative for muscle cramps or aches, joint pain or swelling ?Neuro: Negative for headaches, weakness, numbness, dizziness, passing  out/fainting ?Psych: Negative for depression, anxiety, memory problems ? ?Objective ? ?Physical Exam ?Vitals:  ? 04/29/22 0928  ?BP: 105/70  ?Pulse: 77  ?Temp: 98.2 ?F (36.8 ?C)  ?SpO2: 99%  ? ? ?BP Readings from Last 3 Encounters:  ?04/29/22 105/70  ?02/03/21 140/90  ?12/04/20 117/80  ? ?Wt Readings from Last 3 Encounters:  ?04/29/22 126 lb 6.4 oz (57.3 kg)  ?03/23/21 126 lb (57.2 kg)  ?02/03/21 126 lb 3.2 oz (57.2 kg)  ? ? ?Physical Exam ?Constitutional:   ?   General: She is not in acute distress. ?   Appearance: She is not diaphoretic.  ?HENT:  ?   Head: Normocephalic and atraumatic.  ?Cardiovascular:  ?   Rate and Rhythm: Normal rate and regular rhythm.  ?   Heart sounds: Normal heart sounds.  ?Pulmonary:  ?   Effort: Pulmonary effort is normal.  ?   Breath sounds: Normal breath sounds.  ?Abdominal:  ?   General: Bowel sounds are normal. There is no distension.  ?   Palpations: Abdomen is soft.  ?   Tenderness: There is no abdominal tenderness.  ?Musculoskeletal:  ?   Right lower leg: No edema.  ?   Left lower leg: No edema.  ?Lymphadenopathy:  ?   Cervical: No cervical adenopathy.  ?Skin: ?   General: Skin is warm and dry.  ?Neurological:  ?   Mental Status: She is alert.  ?Psychiatric:     ?   Mood and Affect: Mood normal.  ? ? ? ?Assessment/Plan:  ? ?Problem List Items Addressed This Visit   ? ? Hyperlipidemia  ? Relevant Orders  ? Comp Met (CMET)  ? Lipid panel  ? Routine general medical examination at a health care facility - Primary  ?  Physical exam completed.  I encouraged reduction in soda intake and sweet intake.  I encouraged continued healthy exercise.  She will call to schedule her mammogram.  Cervical cancer screening and colon cancer screening are up-to-date.  She was given her second shingles vaccine.  She was advised to contact us for any postmenopausal bleeding.  Lab work as outlined. ? ?  ?  ? ?Other Visit Diagnoses   ? ? Encounter for screening mammogram for malignant neoplasm of breast       ? Relevant Orders  ? MM 3D SCREEN BREAST BILATERAL  ? Screening for deficiency anemia      ? Relevant Orders  ? CBC w/Diff  ? Thyroid disorder screen      ? Relevant Orders  ? TSH  ? ?  ? ? ?Return in about 6 months (around 10/30/2022) for Hypertension. ? ? ?Misty Rumps, MD ?[place] ? ?

## 2022-04-29 NOTE — Addendum Note (Signed)
Addended by: Charlyne Mom D on: 04/29/2022 12:04 PM ? ? Modules accepted: Orders ? ?

## 2022-04-29 NOTE — Assessment & Plan Note (Signed)
Physical exam completed.  I encouraged reduction in soda intake and sweet intake.  I encouraged continued healthy exercise.  She will call to schedule her mammogram.  Cervical cancer screening and colon cancer screening are up-to-date.  She was given her second shingles vaccine.  She was advised to contact us for any postmenopausal bleeding.  Lab work as outlined. ?

## 2022-04-29 NOTE — Addendum Note (Signed)
Addended by: Glori Luis on: 04/29/2022 09:51 AM ? ? Modules accepted: Orders ? ?

## 2022-05-19 ENCOUNTER — Other Ambulatory Visit: Payer: Self-pay | Admitting: Family Medicine

## 2022-05-19 DIAGNOSIS — I10 Essential (primary) hypertension: Secondary | ICD-10-CM

## 2022-06-16 ENCOUNTER — Other Ambulatory Visit: Payer: Self-pay | Admitting: Family Medicine

## 2022-06-16 DIAGNOSIS — I10 Essential (primary) hypertension: Secondary | ICD-10-CM

## 2022-06-21 ENCOUNTER — Encounter: Payer: Self-pay | Admitting: Family Medicine

## 2022-06-21 ENCOUNTER — Telehealth: Payer: Self-pay

## 2022-06-21 MED ORDER — BUPROPION HCL ER (XL) 300 MG PO TB24: ORAL_TABLET | ORAL | 11 refills | Status: DC

## 2022-06-22 MED ORDER — BUPROPION HCL ER (XL) 300 MG PO TB24: ORAL_TABLET | ORAL | 11 refills | Status: DC

## 2022-06-22 NOTE — Addendum Note (Signed)
Addended by: Glori Luis on: 06/22/2022 10:55 AM   Modules accepted: Orders

## 2022-06-24 ENCOUNTER — Other Ambulatory Visit: Payer: Self-pay

## 2022-06-24 MED ORDER — BUPROPION HCL ER (XL) 300 MG PO TB24: ORAL_TABLET | ORAL | 1 refills | Status: DC

## 2022-06-24 NOTE — Telephone Encounter (Signed)
Victorino Dike from [facility] need a 90 day supply of bupropion for pt [phone]

## 2022-06-24 NOTE — Telephone Encounter (Signed)
90 day sent to [facility].

## 2022-07-09 ENCOUNTER — Other Ambulatory Visit: Payer: Self-pay | Admitting: Family

## 2022-07-09 DIAGNOSIS — I10 Essential (primary) hypertension: Secondary | ICD-10-CM

## 2022-08-26 ENCOUNTER — Ambulatory Visit
Admission: RE | Admit: 2022-08-26 | Discharge: 2022-08-26 | Disposition: A | Discharge status: Home / Self Care | Payer: 59 | Source: Ambulatory Visit | Attending: Family Medicine | Admitting: Family Medicine

## 2022-08-26 VITALS — BP 130/88 | HR 74 | Temp 97.9°F | Resp 16

## 2022-08-26 DIAGNOSIS — T162XXA Foreign body in left ear, initial encounter: Secondary | ICD-10-CM | POA: Diagnosis not present

## 2022-08-26 DIAGNOSIS — H918X2 Other specified hearing loss, left ear: Secondary | ICD-10-CM | POA: Diagnosis not present

## 2022-08-26 NOTE — ED Provider Notes (Signed)
Renaldo Fiddler    CSN: [id number] Arrival date & time: 08/26/22  0810      History   Chief Complaint Chief Complaint  Patient presents with  . Ear Fullness    Ear ache - strange muffled hearing feeling - Entered by patient    HPI Misty Fernandez is a 51 y.o. female.   HPI Patient presents for evaluation of left ear fullness and altered hearing x 1 day. She reports yesterday, sitting at work and developing a ringing and fullness sensation in the left ear. She thought the sensation would resolve without intervention, however, it seemed to worsen and remained present upon awakening today. She denies any other symptoms or problems involving the right ear.  Past Medical History:  Diagnosis Date  . Abnormal renal function test   . Chickenpox   . Depression   . GERD (gastroesophageal reflux disease)   . Headache   . Heart murmur   . High blood pressure   . PONV (postoperative nausea and vomiting)   . Preeclampsia     Patient Active Problem List   Diagnosis Date Noted  . Hypertension 02/03/2021  . Hyperlipidemia 11/06/2020  . GERD (gastroesophageal reflux disease) 10/02/2020  . Chronic kidney disease (CKD), stage III (moderate) (HCC) 10/02/2020  . Depression, major, single episode, moderate (HCC) 04/28/2017  . Routine general medical examination at a health care facility 03/01/2016    Past Surgical History:  Procedure Laterality Date  . COLONOSCOPY WITH PROPOFOL N/A 12/04/2020   Procedure: COLONOSCOPY WITH PROPOFOL;  Surgeon: Midge Minium, MD;  Location: [facility];  Service: Endoscopy;  Laterality: N/A;  priority 4  . KIDNEY STONE SURGERY    . No surgical history    . UTERINE SEPTUM RESECTION  2008    OB History   No obstetric history on file.      Home Medications    Prior to Admission medications   Medication Sig Start Date End Date Taking? Authorizing Provider  buPROPion (WELLBUTRIN XL) 300 MG 24 hr tablet TAKE ONE TABLET BY MOUTH DAILY  ([facility]) 06/24/22   Glori Luis, MD  famotidine (PEPCID) 20 MG tablet Take 1 tablet (20 mg total) by mouth 2 (two) times daily. 04/29/22   Glori Luis, MD  losartan (COZAAR) 100 MG tablet TAKE 1 TABLET BY MOUTH EVERY DAY 06/16/22   Worthy Rancher B, FNP  rosuvastatin (CRESTOR) 20 MG tablet TAKE 1 TABLET BY MOUTH EVERY DAY 04/22/22   Glori Luis, MD    Family History Family History  Problem Relation Age of Onset  . Arthritis Other        Parent, Grandparent  . Heart disease Other        Parent, grandparent  . High blood pressure Other        Parent  . Stroke Other        Grandparent  . Diabetes Other        Parent  . Cancer Father   . Breast cancer Neg Hx     Social History Social History   Tobacco Use  . Smoking status: Former  . Smokeless tobacco: Never  Substance Use Topics  . Alcohol use: Yes    Alcohol/week: 1.0 standard drink of alcohol    Types: 1 Standard drinks or equivalent per week  . Drug use: No     Allergies   Patient has no known allergies.   Review of Systems Review of Systems Pertinent negatives listed in  HPI   Physical Exam Triage Vital Signs ED Triage Vitals [08/26/22 0823]  Enc Vitals Group     BP 130/88     Pulse Rate 74     Resp 16     Temp 97.9 F (36.6 C)     Temp Source Temporal     SpO2 98 %     Weight      Height      Head Circumference      Peak Flow      Pain Score      Pain Loc      Pain Edu?      Excl. in [place]?    No data found.  Updated Vital Signs BP 130/88 (BP Location: Left Arm)   Pulse 74   Temp 97.9 F (36.6 C) (Temporal)   Resp 16   LMP 11/08/2017 (Approximate)   SpO2 98%   Visual Acuity Right Eye Distance:   Left Eye Distance:   Bilateral Distance:    Right Eye Near:   Left Eye Near:    Bilateral Near:     Physical Exam   UC Treatments / Results  Labs (all labs ordered are listed, but only abnormal results are displayed) Labs Reviewed - No data to  display  EKG   Radiology No results found.  Procedures Procedures (including critical care time)  Medications Ordered in UC Medications - No data to display  Initial Impression / Assessment and Plan / UC Course  I have reviewed the triage vital signs and the nursing notes.  Pertinent labs & imaging results that were available during my care of the patient were reviewed by me and considered in my medical decision making (see chart for details).     Final Clinical Impressions(s) / UC Diagnoses   Final diagnoses:  Foreign body of left ear, initial encounter     Discharge Instructions      Give your hearing time to normalize following removal of both foreign bodies. If symptoms persists and do not resolve, follow-up with [facility].     ED Prescriptions   None    PDMP not reviewed this encounter.

## 2022-08-26 NOTE — Discharge Instructions (Addendum)
Give your hearing time to normalize following removal of both foreign bodies. If symptoms persists and do not resolve, follow-up with [facility].

## 2022-08-26 NOTE — ED Triage Notes (Signed)
Patient presents to [facility] for left ear fullness since yesterday. Has not taken anything for symptom relief.   Denies fever or ear drainage.

## 2022-10-24 ENCOUNTER — Other Ambulatory Visit: Payer: Self-pay | Admitting: Family Medicine

## 2022-10-24 DIAGNOSIS — E785 Hyperlipidemia, unspecified: Secondary | ICD-10-CM

## 2023-01-31 ENCOUNTER — Encounter: Payer: Self-pay | Admitting: Emergency Medicine

## 2023-01-31 ENCOUNTER — Ambulatory Visit
Admission: EM | Admit: 2023-01-31 | Discharge: 2023-01-31 | Disposition: A | Discharge status: Home / Self Care | Payer: 59 | Attending: Emergency Medicine | Admitting: Emergency Medicine

## 2023-01-31 DIAGNOSIS — R300 Dysuria: Secondary | ICD-10-CM

## 2023-01-31 LAB — POCT URINALYSIS DIP (MANUAL ENTRY)
Bilirubin, UA: NEGATIVE
Glucose, UA: 100 mg/dL — AB
Leukocytes, UA: NEGATIVE
Nitrite, UA: NEGATIVE
Protein Ur, POC: 100 mg/dL — AB
Spec Grav, UA: 1.015 (ref 1.010–1.025)
Urobilinogen, UA: 1 E.U./dL
pH, UA: >=9 — AB (ref 5.0–8.0)

## 2023-01-31 NOTE — ED Notes (Signed)
Unable to provide urine sample at this time, provided with water.

## 2023-01-31 NOTE — ED Provider Notes (Signed)
Roderic Palau    CSN: [id number] Arrival date & time: 01/31/23  1351      History   Chief Complaint Chief Complaint  Patient presents with   Urinary Frequency    I think I have a UTI. - Entered by patient    HPI Misty Fernandez is a 52 y.o. female.  Patient presents with dysuria and urinary frequency today.  She denies fever, abdominal pain, flank pain, hematuria, vaginal discharge, pelvic pain, or other symptoms.  Treatment at home with [facility].  Her medical history includes hypertension, CKD, GERD.  The history is provided by the patient and medical records.    Past Medical History:  Diagnosis Date   Abnormal renal function test    Chickenpox    Depression    GERD (gastroesophageal reflux disease)    Headache    Heart murmur    High blood pressure    PONV (postoperative nausea and vomiting)    Preeclampsia     Patient Active Problem List   Diagnosis Date Noted   Hypertension 02/03/2021   Hyperlipidemia 11/06/2020   GERD (gastroesophageal reflux disease) 10/02/2020   Chronic kidney disease (CKD), stage III (moderate) ([place]) 10/02/2020   Depression, major, single episode, moderate ([place]) 04/28/2017   Routine general medical examination at a health care facility 03/01/2016    Past Surgical History:  Procedure Laterality Date   COLONOSCOPY WITH PROPOFOL N/A 12/04/2020   Procedure: COLONOSCOPY WITH PROPOFOL;  Surgeon: Lucilla Lame, MD;  Location: [place];  Service: Endoscopy;  Laterality: N/A;  priority 4   KIDNEY STONE SURGERY     No surgical history     UTERINE SEPTUM RESECTION  2008    OB History   No obstetric history on file.      Home Medications    Prior to Admission medications   Medication Sig Start Date End Date Taking? Authorizing Provider  buPROPion (WELLBUTRIN XL) 300 MG 24 hr tablet TAKE ONE TABLET BY MOUTH DAILY ([facility]) 06/24/22   Leone Haven, MD  famotidine (PEPCID) 20 MG tablet Take 1 tablet (20 mg total) by  mouth 2 (two) times daily. 04/29/22   Leone Haven, MD  losartan (COZAAR) 100 MG tablet TAKE 1 TABLET BY MOUTH EVERY DAY 06/16/22   Dutch Quint B, FNP  rosuvastatin (CRESTOR) 20 MG tablet TAKE 1 TABLET BY MOUTH EVERY DAY 10/24/22   Leone Haven, MD    Family History Family History  Problem Relation Age of Onset   Arthritis Other        Parent, Grandparent   Heart disease Other        Parent, grandparent   High blood pressure Other        Parent   Stroke Other        Grandparent   Diabetes Other        Parent   Cancer Father    Breast cancer Neg Hx     Social History Social History   Tobacco Use   Smoking status: Former   Smokeless tobacco: Never  Substance Use Topics   Alcohol use: Yes    Alcohol/week: 1.0 standard drink of alcohol    Types: 1 Standard drinks or equivalent per week   Drug use: No     Allergies   Patient has no known allergies.   Review of Systems Review of Systems  Constitutional:  Negative for chills and fever.  Gastrointestinal:  Negative for abdominal pain, diarrhea, nausea  and vomiting.  Genitourinary:  Positive for dysuria and frequency. Negative for flank pain, hematuria, pelvic pain and vaginal discharge.  Skin:  Negative for rash.  All other systems reviewed and are negative.    Physical Exam Triage Vital Signs ED Triage Vitals [01/31/23 1436]  Enc Vitals Group     BP      Pulse Rate 80     Resp 18     Temp 98 F (36.7 C)     Temp src      SpO2 97 %     Weight      Height      Head Circumference      Peak Flow      Pain Score      Pain Loc      Pain Edu?      Excl. in [place]?    No data found.  Updated Vital Signs BP (!) 143/94   Pulse 80   Temp 98 F (36.7 C)   Resp 18   LMP 11/08/2017 (Approximate)   SpO2 97%   Visual Acuity Right Eye Distance:   Left Eye Distance:   Bilateral Distance:    Right Eye Near:   Left Eye Near:    Bilateral Near:     Physical Exam Vitals and nursing note reviewed.   Constitutional:      General: She is not in acute distress.    Appearance: Normal appearance. She is well-developed. She is not ill-appearing.  HENT:     Mouth/Throat:     Mouth: Mucous membranes are moist.  Cardiovascular:     Rate and Rhythm: Normal rate and regular rhythm.     Heart sounds: Normal heart sounds.  Pulmonary:     Effort: Pulmonary effort is normal. No respiratory distress.     Breath sounds: Normal breath sounds.  Abdominal:     General: Bowel sounds are normal.     Palpations: Abdomen is soft.     Tenderness: There is no abdominal tenderness. There is no right CVA tenderness, left CVA tenderness, guarding or rebound.  Musculoskeletal:     Cervical back: Neck supple.  Skin:    General: Skin is warm and dry.  Neurological:     Mental Status: She is alert.  Psychiatric:        Mood and Affect: Mood normal.        Behavior: Behavior normal.      UC Treatments / Results  Labs (all labs ordered are listed, but only abnormal results are displayed) Labs Reviewed  POCT URINALYSIS DIP (MANUAL ENTRY) - Abnormal; Notable for the following components:      Result Value   Color, UA orange (*)    Clarity, UA cloudy (*)    Glucose, UA =100 (*)    Ketones, POC UA trace (5) (*)    Blood, UA large (*)    pH, UA >=9.0 (*)    Protein Ur, POC =100 (*)    All other components within normal limits    EKG   Radiology No results found.  Procedures Procedures (including critical care time)  Medications Ordered in UC Medications - No data to display  Initial Impression / Assessment and Plan / UC Course  I have reviewed the triage vital signs and the nursing notes.  Pertinent labs & imaging results that were available during my care of the patient were reviewed by me and considered in my medical decision making (see chart for details).  Dysuria.  Urine does not show infection at this time.  Discussed symptomatic treatment including increased water intake.   Education provided on dysuria.  Instructed patient to follow up with her PCP if her symptoms are not improving.  She agrees to plan of care.    Final Clinical Impressions(s) / UC Diagnoses   Final diagnoses:  Dysuria     Discharge Instructions      Your urine does not show signs of infection today.  See the attached information on dysuria.  Follow up with your primary care provider if your symptoms are not improving.        ED Prescriptions   None    PDMP not reviewed this encounter.   Sharion Balloon, NP 01/31/23 [phone]

## 2023-01-31 NOTE — ED Notes (Signed)
Provider triage  

## 2023-01-31 NOTE — Discharge Instructions (Addendum)
Your urine does not show signs of infection today.  See the attached information on dysuria.  Follow up with your primary care provider if your symptoms are not improving.

## 2023-04-06 ENCOUNTER — Other Ambulatory Visit: Payer: Self-pay | Admitting: Family Medicine

## 2023-04-06 DIAGNOSIS — I10 Essential (primary) hypertension: Secondary | ICD-10-CM

## 2023-04-06 MED ORDER — LOSARTAN POTASSIUM 100 MG PO TABS: 100.0000 mg | ORAL_TABLET | Freq: Every day | ORAL | 0 refills | Status: DC

## 2023-04-22 ENCOUNTER — Other Ambulatory Visit: Payer: Self-pay | Admitting: Family Medicine

## 2023-04-22 DIAGNOSIS — K219 Gastro-esophageal reflux disease without esophagitis: Secondary | ICD-10-CM

## 2023-04-23 NOTE — Telephone Encounter (Signed)
SENT MYCHART & NOTE TO PHARMACY-NO MORE REFILLS UNTIL SEEN. LAST SEEN IN MAY 2023.

## 2023-04-25 ENCOUNTER — Other Ambulatory Visit: Payer: Self-pay | Admitting: Family Medicine

## 2023-04-25 DIAGNOSIS — E785 Hyperlipidemia, unspecified: Secondary | ICD-10-CM

## 2023-05-02 ENCOUNTER — Other Ambulatory Visit: Payer: Self-pay | Admitting: Family Medicine

## 2023-05-02 DIAGNOSIS — I10 Essential (primary) hypertension: Secondary | ICD-10-CM

## 2023-05-17 ENCOUNTER — Ambulatory Visit: Payer: 59 | Admitting: Family Medicine

## 2023-05-25 ENCOUNTER — Other Ambulatory Visit: Payer: Self-pay | Admitting: Family Medicine

## 2023-05-25 DIAGNOSIS — K219 Gastro-esophageal reflux disease without esophagitis: Secondary | ICD-10-CM

## 2023-06-19 ENCOUNTER — Encounter: Payer: Self-pay | Admitting: Family Medicine

## 2023-06-19 ENCOUNTER — Ambulatory Visit (INDEPENDENT_AMBULATORY_CARE_PROVIDER_SITE_OTHER): Payer: 59 | Admitting: Family Medicine

## 2023-06-19 VITALS — BP 122/78 | HR 76 | Temp 97.9°F | Ht 63.0 in | Wt 119.6 lb

## 2023-06-19 DIAGNOSIS — Z789 Other specified health status: Secondary | ICD-10-CM

## 2023-06-19 DIAGNOSIS — R7309 Other abnormal glucose: Secondary | ICD-10-CM

## 2023-06-19 DIAGNOSIS — R110 Nausea: Secondary | ICD-10-CM | POA: Diagnosis not present

## 2023-06-19 DIAGNOSIS — I10 Essential (primary) hypertension: Secondary | ICD-10-CM

## 2023-06-19 DIAGNOSIS — Z833 Family history of diabetes mellitus: Secondary | ICD-10-CM

## 2023-06-19 DIAGNOSIS — E785 Hyperlipidemia, unspecified: Secondary | ICD-10-CM

## 2023-06-19 DIAGNOSIS — F321 Major depressive disorder, single episode, moderate: Secondary | ICD-10-CM | POA: Diagnosis not present

## 2023-06-19 DIAGNOSIS — Z1231 Encounter for screening mammogram for malignant neoplasm of breast: Secondary | ICD-10-CM

## 2023-06-19 HISTORY — DX: Family history of diabetes mellitus: Z83.3

## 2023-06-19 LAB — COMPREHENSIVE METABOLIC PANEL
ALT: 17 U/L (ref 0–35)
AST: 30 U/L (ref 0–37)
Albumin: 4.8 g/dL (ref 3.5–5.2)
Alkaline Phosphatase: 70 U/L (ref 39–117)
BUN: 23 mg/dL (ref 6–23)
CO2: 33 mEq/L — ABNORMAL HIGH (ref 19–32)
Calcium: 11.2 mg/dL — ABNORMAL HIGH (ref 8.4–10.5)
Chloride: 98 mEq/L (ref 96–112)
Creatinine, Ser: 1.24 mg/dL — ABNORMAL HIGH (ref 0.40–1.20)
GFR: 49.85 mL/min — ABNORMAL LOW (ref >60.00)
Glucose, Bld: 96 mg/dL (ref 70–99)
Potassium: 3.7 mEq/L (ref 3.5–5.1)
Sodium: 141 mEq/L (ref 135–145)
Total Bilirubin: 0.6 mg/dL (ref 0.2–1.2)
Total Protein: 7.5 g/dL (ref 6.0–8.3)

## 2023-06-19 LAB — CBC WITH DIFFERENTIAL/PLATELET
Basophils Absolute: 0 10*3/uL (ref 0.0–0.1)
Basophils Relative: 0.5 % (ref 0.0–3.0)
Eosinophils Absolute: 0.1 10*3/uL (ref 0.0–0.7)
Eosinophils Relative: 1.2 % (ref 0.0–5.0)
HCT: 43.7 % (ref 36.0–46.0)
Hemoglobin: 14.6 g/dL (ref 12.0–15.0)
Lymphocytes Relative: 15.4 % (ref 12.0–46.0)
Lymphs Abs: 1 10*3/uL (ref 0.7–4.0)
MCHC: 33.5 g/dL (ref 30.0–36.0)
MCV: 92.8 fl (ref 78.0–100.0)
Monocytes Absolute: 0.4 10*3/uL (ref 0.1–1.0)
Monocytes Relative: 6.6 % (ref 3.0–12.0)
Neutro Abs: 4.8 10*3/uL (ref 1.4–7.7)
Neutrophils Relative %: 76.3 % (ref 43.0–77.0)
Platelets: 241 10*3/uL (ref 150.0–400.0)
RBC: 4.71 Mil/uL (ref 3.87–5.11)
RDW: 12.7 % (ref 11.5–15.5)
WBC: 6.3 10*3/uL (ref 4.0–10.5)

## 2023-06-19 LAB — LIPID PANEL
Cholesterol: 136 mg/dL (ref 0–200)
HDL: 62.4 mg/dL (ref >39.00)
LDL Cholesterol: 58 mg/dL (ref 0–99)
NonHDL: 73.53
Total CHOL/HDL Ratio: 2
Triglycerides: 78 mg/dL (ref 0.0–149.0)
VLDL: 15.6 mg/dL (ref 0.0–40.0)

## 2023-06-19 LAB — TSH: TSH: 0.84 u[IU]/mL (ref 0.35–5.50)

## 2023-06-19 LAB — HEMOGLOBIN A1C: Hgb A1c MFr Bld: 5.4 % (ref 4.6–6.5)

## 2023-06-19 LAB — VITAMIN B12: Vitamin B-12: 552 pg/mL (ref 211–911)

## 2023-06-19 MED ORDER — BUPROPION HCL ER (XL) 300 MG PO TB24: ORAL_TABLET | ORAL | 1 refills | Status: DC

## 2023-06-19 MED ORDER — LOSARTAN POTASSIUM 100 MG PO TABS: 100.0000 mg | ORAL_TABLET | Freq: Every day | ORAL | 1 refills | Status: DC

## 2023-06-19 NOTE — Assessment & Plan Note (Signed)
Chronic issue.  Generally well-controlled today.  Discussed checking at home on a daily basis and if it is running greater than 130/80 consistently she will let us know.  Continue losartan 100 mg daily.

## 2023-06-19 NOTE — Assessment & Plan Note (Signed)
Chronic issue.  Continue Crestor 20 mg daily.  Check labs. 

## 2023-06-19 NOTE — Progress Notes (Signed)
Marikay Alar, MD Phone: [phone]  Misty Fernandez is a 52 y.o. female who presents today for f/u.  HYPERTENSION Disease Monitoring Home BP Monitoring not checking Chest pain- no    Dyspnea- no Does note an occasional low grade headache that she thinks could be from her BP Medications Compliance-  taking losartan.   Edema- no BMET    Component Value Date/Time   NA 142 04/29/2022 0950   K 3.5 04/29/2022 0950   CL 99 04/29/2022 0950   CO2 34 (H) 04/29/2022 0950   GLUCOSE 117 (H) 04/29/2022 0950   BUN 17 04/29/2022 0950   CREATININE 1.04 04/29/2022 0950   CALCIUM 10.0 04/29/2022 0950   Depression: Patient notes no depression.  She is on Wellbutrin.  No SI.  Family history of type 1 diabetes: Patient reports her mother was diagnosed with type 1 diabetes at age 22.  Her mother is having a lot of health issues related to this at this time.  No polyuria or polydipsia.  Nausea: Patient notes certain things she eats make her feel nauseous and do not sit well on her stomach.  She gives examples of hard-boiled eggs and bagels as things that do this.  She is vegetarian and eats lots of fruits and vegetables.  She notes no associated vomiting or diarrhea.  Social History   Tobacco Use  Smoking Status Former  Smokeless Tobacco Never    Current Outpatient Medications on File Prior to Visit  Medication Sig Dispense Refill   famotidine (PEPCID) 20 MG tablet TAKE 1 TABLET (20 MG TOTAL) BY MOUTH 2 (TWO) TIMES DAILY. APPT NEEDED FOR ADDITIONAL REFILLS 180 tablet 1   rosuvastatin (CRESTOR) 20 MG tablet TAKE 1 TABLET BY MOUTH EVERY DAY 90 tablet 1   No current facility-administered medications on file prior to visit.     ROS see history of present illness  Objective  Physical Exam Vitals:   06/19/23 0800 06/19/23 0815  BP: 124/82 122/78  Pulse: 76   Temp: 97.9 F (36.6 C)   SpO2: 97%     BP Readings from Last 3 Encounters:  06/19/23 122/78  01/31/23 (!) 143/94   08/26/22 130/88   Wt Readings from Last 3 Encounters:  06/19/23 119 lb 9.6 oz (54.3 kg)  04/29/22 126 lb 6.4 oz (57.3 kg)  03/23/21 126 lb (57.2 kg)    Physical Exam Constitutional:      General: She is not in acute distress.    Appearance: She is not diaphoretic.  Cardiovascular:     Rate and Rhythm: Normal rate and regular rhythm.     Heart sounds: Normal heart sounds.  Pulmonary:     Effort: Pulmonary effort is normal.     Breath sounds: Normal breath sounds.  Musculoskeletal:     Right lower leg: No edema.     Left lower leg: No edema.  Skin:    General: Skin is warm and dry.  Neurological:     Mental Status: She is alert.      Assessment/Plan: Please see individual problem list.  Hypertension, unspecified type Assessment & Plan: Chronic issue.  Generally well-controlled today.  Discussed checking at home on a daily basis and if it is running greater than 130/80 consistently she will let us know.  Continue losartan 100 mg daily.  Orders: -     Losartan Potassium; Take 1 tablet (100 mg total) by mouth daily.  Dispense: 90 tablet; Refill: 1 -     TSH -  CBC with Differential/Platelet  Depression, major, single episode, moderate (HCC) Assessment & Plan: Chronic issue.  Stable.  Continue Wellbutrin 300 mg daily.  Orders: -     buPROPion HCl ER (XL); TAKE ONE TABLET BY MOUTH DAILY ([facility])  Dispense: 90 tablet; Refill: 1  Hyperlipidemia, unspecified hyperlipidemia type Assessment & Plan: Chronic issue.  Continue Crestor 20 mg daily.  Check labs.  Orders: -     Comprehensive metabolic panel -     Lipid panel  Nausea Assessment & Plan: Possibly related to food intolerance.  Discussed avoiding food triggers.  We will check some lab work today to evaluate for possible underlying causes.  Orders: -     TSH -     CBC with Differential/Platelet  Family history of type 1 diabetes mellitus Assessment & Plan: Patient's mother diagnosed with type 1  diabetes at quite an elderly age.  Discussed given this history we could screen her with diabetic autoantibody screening.  Will also perform an A1c on her given her previously elevated glucose.  Patient notes she is fasting today.  I did discuss that I was not quite sure if her insurance would cover this for this diagnosis.  Orders: -     GAD65, IA-2, and Insulin Autoantibody serum  Encounter for screening mammogram for malignant neoplasm of breast -     3D Screening Mammogram, Left and Right; Future  Elevated glucose -     Hemoglobin A1c  Vegetarian Assessment & Plan: Patient is vegetarian.  Plan to check a B12 level today.  Orders: -     Vitamin B12     Health Maintenance: patient will call to schedule her mammogram.   Return in about 6 months (around 12/19/2023) for physical.   Marikay Alar, MD [facility]

## 2023-06-19 NOTE — Patient Instructions (Signed)
Nice to see you. We will check lab work today and contact with results. Please start checking your blood pressure at home on a daily basis.  If your blood pressures running greater than 130/80 consistently please let us know.

## 2023-06-19 NOTE — Assessment & Plan Note (Signed)
Patient is vegetarian.  Plan to check a B12 level today.

## 2023-06-19 NOTE — Assessment & Plan Note (Signed)
>>  ASSESSMENT AND PLAN FOR DEPRESSION, MAJOR, SINGLE EPISODE, MODERATE (HCC) WRITTEN ON 06/19/2023  8:15 AM BY SONNENBERG, ERIC G, MD  Chronic issue.  Stable.  Continue Wellbutrin  300 mg daily.

## 2023-06-19 NOTE — Assessment & Plan Note (Signed)
Possibly related to food intolerance.  Discussed avoiding food triggers.  We will check some lab work today to evaluate for possible underlying causes.

## 2023-06-19 NOTE — Assessment & Plan Note (Signed)
Chronic issue.  Stable.  Continue Wellbutrin 300 mg daily.

## 2023-06-19 NOTE — Assessment & Plan Note (Signed)
Patient's mother diagnosed with type 1 diabetes at quite an elderly age.  Discussed given this history we could screen her with diabetic autoantibody screening.  Will also perform an A1c on her given her previously elevated glucose.  Patient notes she is fasting today.  I did discuss that I was not quite sure if her insurance would cover this for this diagnosis.

## 2023-06-23 NOTE — Addendum Note (Signed)
Addended by: Prince Solian A on: 06/23/2023 12:22 PM   Modules accepted: Orders

## 2023-07-01 LAB — GAD65, IA-2, AND INSULIN AUTOANTIBODY SERUM
Glutamic Acid Decarb Ab: <5 IU/mL (ref <5)
IA-2 Antibody: <5.4 U/mL (ref <5.4)
Insulin Antibodies, Human: <0.4 U/mL (ref <0.4)

## 2023-07-10 ENCOUNTER — Other Ambulatory Visit (INDEPENDENT_AMBULATORY_CARE_PROVIDER_SITE_OTHER): Payer: Managed Care, Other (non HMO)

## 2023-07-10 DIAGNOSIS — R7309 Other abnormal glucose: Secondary | ICD-10-CM

## 2023-07-10 LAB — BASIC METABOLIC PANEL
BUN: 15 mg/dL (ref 6–23)
CO2: 30 mEq/L (ref 19–32)
Calcium: 10 mg/dL (ref 8.4–10.5)
Chloride: 102 mEq/L (ref 96–112)
Creatinine, Ser: 0.99 mg/dL (ref 0.40–1.20)
GFR: 65.29 mL/min (ref >60.00)
Glucose, Bld: 104 mg/dL — ABNORMAL HIGH (ref 70–99)
Potassium: 3.9 mEq/L (ref 3.5–5.1)
Sodium: 139 mEq/L (ref 135–145)

## 2023-08-07 ENCOUNTER — Encounter: Payer: Self-pay | Admitting: Family Medicine

## 2023-08-07 DIAGNOSIS — Z124 Encounter for screening for malignant neoplasm of cervix: Secondary | ICD-10-CM

## 2023-08-29 ENCOUNTER — Encounter: Payer: Self-pay | Admitting: Family Medicine

## 2023-08-29 ENCOUNTER — Other Ambulatory Visit: Payer: Self-pay | Admitting: Family Medicine

## 2023-08-29 DIAGNOSIS — K219 Gastro-esophageal reflux disease without esophagitis: Secondary | ICD-10-CM

## 2023-10-30 ENCOUNTER — Other Ambulatory Visit: Payer: Self-pay | Admitting: Family Medicine

## 2023-10-30 DIAGNOSIS — E785 Hyperlipidemia, unspecified: Secondary | ICD-10-CM

## 2023-11-29 ENCOUNTER — Other Ambulatory Visit: Payer: Self-pay | Admitting: Family Medicine

## 2023-11-29 DIAGNOSIS — F321 Major depressive disorder, single episode, moderate: Secondary | ICD-10-CM

## 2023-11-29 DIAGNOSIS — I10 Essential (primary) hypertension: Secondary | ICD-10-CM

## 2023-12-18 ENCOUNTER — Encounter: Payer: 59 | Admitting: Family Medicine

## 2024-02-15 ENCOUNTER — Encounter: Payer: Self-pay | Admitting: Family Medicine

## 2024-03-07 ENCOUNTER — Telehealth: Payer: Self-pay | Admitting: Family Medicine

## 2024-03-07 NOTE — Telephone Encounter (Signed)
 Dr Birdie Sons is no longer at this location. Please call the office to schedule a Transfer of Care to either Dr Charlann Lange, Darleen Crocker or Kara Dies, NP. [id number] please schedule.  Thank you

## 2024-06-26 ENCOUNTER — Ambulatory Visit

## 2024-06-26 ENCOUNTER — Ambulatory Visit: Payer: Self-pay

## 2024-06-26 VITALS — BP 110/75 | HR 72 | Temp 98.0°F | Ht 63.0 in | Wt 122.8 lb

## 2024-06-26 DIAGNOSIS — N182 Chronic kidney disease, stage 2 (mild): Secondary | ICD-10-CM | POA: Diagnosis not present

## 2024-06-26 DIAGNOSIS — Z0000 Encounter for general adult medical examination without abnormal findings: Secondary | ICD-10-CM

## 2024-06-26 DIAGNOSIS — E785 Hyperlipidemia, unspecified: Secondary | ICD-10-CM

## 2024-06-26 DIAGNOSIS — F321 Major depressive disorder, single episode, moderate: Secondary | ICD-10-CM

## 2024-06-26 DIAGNOSIS — F39 Unspecified mood [affective] disorder: Secondary | ICD-10-CM | POA: Diagnosis not present

## 2024-06-26 DIAGNOSIS — Z789 Other specified health status: Secondary | ICD-10-CM

## 2024-06-26 DIAGNOSIS — I10 Essential (primary) hypertension: Secondary | ICD-10-CM

## 2024-06-26 DIAGNOSIS — K219 Gastro-esophageal reflux disease without esophagitis: Secondary | ICD-10-CM

## 2024-06-26 DIAGNOSIS — Z8349 Family history of other endocrine, nutritional and metabolic diseases: Secondary | ICD-10-CM | POA: Diagnosis not present

## 2024-06-26 DIAGNOSIS — N959 Unspecified menopausal and perimenopausal disorder: Secondary | ICD-10-CM | POA: Diagnosis not present

## 2024-06-26 DIAGNOSIS — R7309 Other abnormal glucose: Secondary | ICD-10-CM | POA: Diagnosis not present

## 2024-06-26 DIAGNOSIS — D751 Secondary polycythemia: Secondary | ICD-10-CM | POA: Insufficient documentation

## 2024-06-26 DIAGNOSIS — Z1231 Encounter for screening mammogram for malignant neoplasm of breast: Secondary | ICD-10-CM

## 2024-06-26 DIAGNOSIS — Z23 Encounter for immunization: Secondary | ICD-10-CM

## 2024-06-26 DIAGNOSIS — L900 Lichen sclerosus et atrophicus: Secondary | ICD-10-CM

## 2024-06-26 DIAGNOSIS — E782 Mixed hyperlipidemia: Secondary | ICD-10-CM

## 2024-06-26 DIAGNOSIS — R110 Nausea: Secondary | ICD-10-CM

## 2024-06-26 LAB — COMPREHENSIVE METABOLIC PANEL WITH GFR
ALT: 13 U/L (ref 0–35)
AST: 22 U/L (ref 0–37)
Albumin: 4.9 g/dL (ref 3.5–5.2)
Alkaline Phosphatase: 53 U/L (ref 39–117)
BUN: 15 mg/dL (ref 6–23)
CO2: 31 meq/L (ref 19–32)
Calcium: 10 mg/dL (ref 8.4–10.5)
Chloride: 102 meq/L (ref 96–112)
Creatinine, Ser: 1.08 mg/dL (ref 0.40–1.20)
GFR: 58.42 mL/min — ABNORMAL LOW (ref >60.00)
Glucose, Bld: 98 mg/dL (ref 70–99)
Potassium: 4.1 meq/L (ref 3.5–5.1)
Sodium: 139 meq/L (ref 135–145)
Total Bilirubin: 0.4 mg/dL (ref 0.2–1.2)
Total Protein: 7.5 g/dL (ref 6.0–8.3)

## 2024-06-26 LAB — CBC WITH DIFFERENTIAL/PLATELET
Basophils Absolute: 0 10*3/uL (ref 0.0–0.1)
Basophils Relative: 0.8 % (ref 0.0–3.0)
Eosinophils Absolute: 0.1 10*3/uL (ref 0.0–0.7)
Eosinophils Relative: 2.2 % (ref 0.0–5.0)
HCT: 44.7 % (ref 36.0–46.0)
Hemoglobin: 15.2 g/dL — ABNORMAL HIGH (ref 12.0–15.0)
Lymphocytes Relative: 23.6 % (ref 12.0–46.0)
Lymphs Abs: 1.3 10*3/uL (ref 0.7–4.0)
MCHC: 34 g/dL (ref 30.0–36.0)
MCV: 92.1 fl (ref 78.0–100.0)
Monocytes Absolute: 0.4 10*3/uL (ref 0.1–1.0)
Monocytes Relative: 7.5 % (ref 3.0–12.0)
Neutro Abs: 3.8 10*3/uL (ref 1.4–7.7)
Neutrophils Relative %: 65.9 % (ref 43.0–77.0)
Platelets: 255 10*3/uL (ref 150.0–400.0)
RBC: 4.85 Mil/uL (ref 3.87–5.11)
RDW: 12.5 % (ref 11.5–15.5)
WBC: 5.7 10*3/uL (ref 4.0–10.5)

## 2024-06-26 LAB — IBC + FERRITIN
Ferritin: 29 ng/mL (ref 10.0–291.0)
Iron: 62 ug/dL (ref 42–145)
Saturation Ratios: 18.8 % — ABNORMAL LOW (ref 20.0–50.0)
TIBC: 330.4 ug/dL (ref 250.0–450.0)
Transferrin: 236 mg/dL (ref 212.0–360.0)

## 2024-06-26 LAB — LIPID PANEL
Cholesterol: 163 mg/dL (ref 0–200)
HDL: 65.5 mg/dL (ref >39.00)
LDL Cholesterol: 72 mg/dL (ref 0–99)
NonHDL: 97.8
Total CHOL/HDL Ratio: 2
Triglycerides: 127 mg/dL (ref 0.0–149.0)
VLDL: 25.4 mg/dL (ref 0.0–40.0)

## 2024-06-26 LAB — HEMOGLOBIN A1C: Hgb A1c MFr Bld: 5.5 % (ref 4.6–6.5)

## 2024-06-26 LAB — VITAMIN B12: Vitamin B-12: 559 pg/mL (ref 211–911)

## 2024-06-26 MED ORDER — LOSARTAN POTASSIUM 100 MG PO TABS: 100.0000 mg | ORAL_TABLET | Freq: Every day | ORAL | 1 refills | Status: AC

## 2024-06-26 MED ORDER — BUPROPION HCL ER (XL) 300 MG PO TB24
ORAL_TABLET | ORAL | 1 refills | Status: AC
Start: 2024-06-26 — End: ?

## 2024-06-26 MED ORDER — ROSUVASTATIN CALCIUM 20 MG PO TABS: 20.0000 mg | ORAL_TABLET | Freq: Every day | ORAL | 1 refills | Status: DC

## 2024-06-26 MED ORDER — FAMOTIDINE 20 MG PO TABS: 20.0000 mg | ORAL_TABLET | Freq: Two times a day (BID) | ORAL | 1 refills | Status: AC

## 2024-06-26 NOTE — Patient Instructions (Addendum)
 YOUR MAMMOGRAM IS DUE, PLEASE CALL AND GET THIS SCHEDULED! [facility] - call [phone]    I am checking your CBC, iron panel. If these comes back negative I will leave it up to you to get genetic testing done for Hemochromatosis. If these comes back abnormal I would definitely recommend getting genetic test done.   Reach out to pharmacy/insurance for updating hepatitis B immunization.

## 2024-06-26 NOTE — Assessment & Plan Note (Addendum)
 Elevated spot blood glucose on lab from 07/10/23. Will check for A1c today.

## 2024-06-26 NOTE — Assessment & Plan Note (Signed)
 H/O CKD, Prevnar 20 was given during today's visit.

## 2024-06-26 NOTE — Assessment & Plan Note (Signed)
 Physical exam completed.  Due for mammogram: ordered, patient provided with phone number to schedule  for screening mammogram.  UDT on cervical cancer screening and colon cancer screening. Due for Hep B immunization. Counseled patient to check with pharmacy/insurance to update immunization.

## 2024-06-26 NOTE — Assessment & Plan Note (Signed)
 Check B 12. Continue daily multivitamin.

## 2024-06-26 NOTE — Assessment & Plan Note (Addendum)
 Mom recently diagnosed with hemochromatosis, type I diabetes.  Check iron panel, CBC. If either transferrin or ferritin is elevated, recommend HFE genetic testing.

## 2024-06-26 NOTE — Assessment & Plan Note (Signed)
 On Estradiol patch 0.05 mg/24 hr (twice weekly) and Progesterone 200 mg once a day as recommended by ob/gyn Dr. Valley).

## 2024-06-26 NOTE — Assessment & Plan Note (Signed)
 BP within goal, continue Losartan  100 mg daily.

## 2024-06-26 NOTE — Assessment & Plan Note (Signed)
 Chronic issue.   Continue Crestor  20 mg daily.   Check lipid panel today.

## 2024-06-26 NOTE — Assessment & Plan Note (Signed)
 Repeat CMP today.  Counseled patient on daily hydration with 50-60 oz water , avoiding nephrotoxic medications like NSAIDs. Will continue to monitor.

## 2024-06-26 NOTE — Progress Notes (Signed)
 Established Patient Office Visit TOC from Dr. Maribeth (last OV 06/19/23)    Subjective  Patient ID: Misty Fernandez, female    DOB: 24-Jun-1971  Age: 53 y.o. MRN: [id number]  Chief Complaint  Patient presents with   Establish Care    She  has a past medical history of Abnormal renal function test, Chickenpox, Depression, Family history of type 1 diabetes mellitus (06/19/2023), GERD (gastroesophageal reflux disease), Headache, Heart murmur, High blood pressure, PONV (postoperative nausea and vomiting), and Preeclampsia.  HPI - Preventative:  Due for screening mammogram, Hepatitis B immunization.  Exercises regularly.  Is vegetarian. Takes daily multivitamin.  Colorectal cancer screening: UDT, repeat in 12/14/2030 PAP: UDT   - Mood disorder: On Bupropion  300 mg daily. Mood has been stable. No SI/HI.   - Hypertension: Not checking BP at home. On Losartan  100 mg daily. No chest pain, leg edema.   - Hyperlipidemia: Rosuvastatin  20 mg daily. No abdominal pain, tolerating well.   - GERD: On Pepcid /famotidine  20 mg twice a day. Patient reports symptoms stable with current treatment. When she misses dose she can feel bitter taste in her mouth.   - Patient reports since her last visit with Dr. Maribeth her mom was diagnosed with hemochromatosis. Would like to get evaluated to see if she is at risk for hemochromatosis.    - CKD: CMP at the time showed GFR of 49.85, cr 1.24  Repeat BMP, Gfr improved on 07/10/23. Patient does not take NSAIDs, reports can improve daily water  intake.    - Has a h/o murmur since she was a child. Asymptomatic. No echo in patient's chart.   - Lichen sclerosus and menopausal symptoms: Established with OB/Gyn Dr. Verdon:  On steroid cream prn ( once or twice a month on average). On Estradiol patch 0.05 mg/24 hr (twice weekly) and Progesterone 200 mg once a day. Symptoms like hot flashes, night sweats improved with treatment.   ROS As per HPI     Objective:     BP 110/75   Pulse 72   Temp 98 F (36.7 C) (Oral)   Ht 5' 3 (1.6 m)   Wt 122 lb 12.8 oz (55.7 kg)   LMP 11/08/2017 (Approximate)   SpO2 98%   BMI 21.75 kg/m      06/26/2024   10:50 AM 06/19/2023    8:02 AM 04/29/2022    9:29 AM  Depression screen PHQ 2/9  Decreased Interest 0 0 0  Down, Depressed, Hopeless 0 0 0  PHQ - 2 Score 0 0 0  Altered sleeping 1 0   Tired, decreased energy 1 0   Change in appetite 0 0   Feeling bad or failure about yourself  0 0   Trouble concentrating 0 0   Moving slowly or fidgety/restless 0 0   Suicidal thoughts 0 0   PHQ-9 Score 2 0   Difficult doing work/chores Not difficult at all Not difficult at all       06/26/2024   10:50 AM 06/19/2023    8:02 AM  GAD 7 : Generalized Anxiety Score  Nervous, Anxious, on Edge 0 0  Control/stop worrying 0 0  Worry too much - different things 0 0  Trouble relaxing 0 0  Restless 0 0  Easily annoyed or irritable 0 0  Afraid - awful might happen 0 0  Total GAD 7 Score 0 0  Anxiety Difficulty Not difficult at all Not difficult at all      06/26/2024  10:50 AM 06/19/2023    8:02 AM 04/29/2022    9:29 AM  Depression screen PHQ 2/9  Decreased Interest 0 0 0  Down, Depressed, Hopeless 0 0 0  PHQ - 2 Score 0 0 0  Altered sleeping 1 0   Tired, decreased energy 1 0   Change in appetite 0 0   Feeling bad or failure about yourself  0 0   Trouble concentrating 0 0   Moving slowly or fidgety/restless 0 0   Suicidal thoughts 0 0   PHQ-9 Score 2 0   Difficult doing work/chores Not difficult at all Not difficult at all       06/26/2024   10:50 AM 06/19/2023    8:02 AM  GAD 7 : Generalized Anxiety Score  Nervous, Anxious, on Edge 0 0  Control/stop worrying 0 0  Worry too much - different things 0 0  Trouble relaxing 0 0  Restless 0 0  Easily annoyed or irritable 0 0  Afraid - awful might happen 0 0  Total GAD 7 Score 0 0  Anxiety Difficulty Not difficult at all Not difficult at all   SDOH  Screenings   Depression (PHQ2-9): Low Risk  (06/26/2024)  Tobacco Use: Medium Risk (06/26/2024)     Physical Exam Constitutional:      General: She is not in acute distress.    Appearance: Normal appearance.  HENT:     Head: Normocephalic and atraumatic.     Right Ear: Tympanic membrane normal.     Left Ear: Tympanic membrane normal.     Mouth/Throat:     Mouth: Mucous membranes are moist.  Neck:     Thyroid : No thyroid  mass or thyroid  tenderness.  Cardiovascular:     Rate and Rhythm: Normal rate and regular rhythm.     Heart sounds: Murmur (systolic murmur grade I/VI) heard.  Pulmonary:     Effort: Pulmonary effort is normal.     Breath sounds: Normal breath sounds.  Abdominal:     General: Bowel sounds are normal.     Palpations: Abdomen is soft.     Tenderness: There is no abdominal tenderness. There is no guarding.  Musculoskeletal:     Cervical back: Neck supple. No rigidity or tenderness.     Right lower leg: No edema.     Left lower leg: No edema.  Lymphadenopathy:     Cervical: No cervical adenopathy.  Skin:    General: Skin is warm.  Neurological:     Mental Status: She is alert and oriented to person, place, and time.  Psychiatric:        Mood and Affect: Mood normal.        Behavior: Behavior normal.        No results found for any visits on 06/26/24.  The 10-year ASCVD risk score (Arnett DK, et al., 2019) is: 1.2%     Assessment & Plan:   Annual physical exam Assessment & Plan: Physical exam completed.  Due for mammogram: ordered, patient provided with phone number to schedule  for screening mammogram.  UDT on cervical cancer screening and colon cancer screening. Due for Hep B immunization. Counseled patient to check with pharmacy/insurance to update immunization.     Mood disorder (HCC) Assessment & Plan: Stable on bupropion  300 mg daily, continue. Refill sent.   Orders: -     buPROPion  HCl ER (XL); TAKE ONE TABLET BY MOUTH DAILY ([facility]  PHARM)  Dispense: 90 tablet; Refill: 1  Primary hypertension Assessment & Plan: BP within goal, continue Losartan  100 mg daily.   Orders: -     Losartan  Potassium; Take 1 tablet (100 mg total) by mouth daily.  Dispense: 90 tablet; Refill: 1  Gastroesophageal reflux disease, unspecified whether esophagitis present Assessment & Plan: Symptoms stable on Famotidine  20 mg twice a day. No unintentional weight loss. Continue. Consider EGD if symptoms worsens.   Orders: -     Famotidine ; Take 1 tablet (20 mg total) by mouth 2 (two) times daily. Appt needed for additional refills  Dispense: 180 tablet; Refill: 1  Vegetarian Assessment & Plan: Check B 12. Continue daily multivitamin.   Orders: -     Vitamin B12  Elevated glucose Assessment & Plan: Elevated spot blood glucose on lab from 07/10/23. Will check for A1c today.   Orders: -     Hemoglobin A1c  Encounter for screening mammogram for breast cancer Assessment & Plan: Physical exam completed.  Due for mammogram: ordered, patient provided with phone number to schedule  for screening mammogram.  UDT on cervical cancer screening and colon cancer screening. Due for Hep B immunization. Counseled patient to check with pharmacy/insurance to update immunization.    Orders: -     3D Screening Mammogram, Left and Right; Future  Need for Streptococcus pneumoniae vaccination Assessment & Plan: H/O CKD, Prevnar 20 was given during today's visit.   Orders: -     Pneumococcal conjugate vaccine 20-valent  Family history of hemochromatosis Assessment & Plan: Mom recently diagnosed with hemochromatosis, type I diabetes.  Check iron panel, CBC. If either transferrin or ferritin is elevated, recommend HFE genetic testing.   Orders: -     IBC + Ferritin -     CBC with Differential/Platelet  Chronic kidney disease (CKD), stage II (mild) Assessment & Plan: Repeat CMP today.  Counseled patient on daily hydration with 50-60 oz water ,  avoiding nephrotoxic medications like NSAIDs. Will continue to monitor.   Orders: -     Comprehensive metabolic panel with GFR  Mixed hyperlipidemia Assessment & Plan: Chronic issue.   Continue Crestor  20 mg daily.   Check lipid panel today.   Orders: -     Rosuvastatin  Calcium ; Take 1 tablet (20 mg total) by mouth daily.  Dispense: 90 tablet; Refill: 1 -     Lipid panel  Lichen sclerosus Assessment & Plan: Management and f/u per ob/gyn ([facility])   Menopausal disorder Assessment & Plan: On Estradiol patch 0.05 mg/24 hr (twice weekly) and Progesterone 200 mg once a day as recommended by ob/gyn Dr. Valley).      No follow-ups on file.   Luke Shade, MD

## 2024-06-26 NOTE — Assessment & Plan Note (Signed)
 Management and f/u per ob/gyn ([facility])

## 2024-06-26 NOTE — Assessment & Plan Note (Signed)
 Stable on bupropion  300 mg daily, continue. Refill sent.

## 2024-06-26 NOTE — Assessment & Plan Note (Signed)
 Symptoms stable on Famotidine  20 mg twice a day. No unintentional weight loss. Continue. Consider EGD if symptoms worsens.

## 2024-06-27 ENCOUNTER — Other Ambulatory Visit (INDEPENDENT_AMBULATORY_CARE_PROVIDER_SITE_OTHER)

## 2024-06-27 DIAGNOSIS — D751 Secondary polycythemia: Secondary | ICD-10-CM

## 2024-06-27 DIAGNOSIS — Z8349 Family history of other endocrine, nutritional and metabolic diseases: Secondary | ICD-10-CM

## 2024-06-27 NOTE — Addendum Note (Signed)
 Addended by: Johanthan Kneeland on: 06/27/2024 09:32 AM   Modules accepted: Orders

## 2024-06-27 NOTE — Telephone Encounter (Signed)
 1. Family history of hemochromatosis (Primary) - Hemochromatosis DNA-PCR(c282y,h63d); Future  2. Polycythemia - Hemochromatosis DNA-PCR(c282y,h63d); Future  Mom with recent d/x of hemochromatosis and complications related to it. Her father is deceased. She has had polycythemia for at least couple of years. Hematology referral pending results.   Luke Shade, MD

## 2024-07-04 ENCOUNTER — Ambulatory Visit: Payer: Self-pay

## 2024-07-04 DIAGNOSIS — Z148 Genetic carrier of other disease: Secondary | ICD-10-CM | POA: Insufficient documentation

## 2024-07-04 LAB — HEMOCHROMATOSIS DNA-PCR(C282Y,H63D)

## 2024-09-18 ENCOUNTER — Other Ambulatory Visit: Payer: Self-pay

## 2024-09-18 DIAGNOSIS — Z1231 Encounter for screening mammogram for malignant neoplasm of breast: Secondary | ICD-10-CM

## 2024-09-23 ENCOUNTER — Ambulatory Visit
Admission: RE | Admit: 2024-09-23 | Discharge: 2024-09-23 | Disposition: A | Discharge status: Home / Self Care | Source: Ambulatory Visit

## 2024-09-23 DIAGNOSIS — Z1231 Encounter for screening mammogram for malignant neoplasm of breast: Secondary | ICD-10-CM

## 2024-09-25 ENCOUNTER — Ambulatory Visit: Payer: Self-pay

## 2025-01-03 ENCOUNTER — Other Ambulatory Visit: Payer: Self-pay

## 2025-01-03 DIAGNOSIS — E782 Mixed hyperlipidemia: Secondary | ICD-10-CM
